# Patient Record
Sex: Female | Born: 1975
Health system: Southern US, Community
[De-identification: ages and names within clinical notes are randomized; demographics above are authoritative.]

## PROBLEM LIST (undated history)

## (undated) DIAGNOSIS — E039 Hypothyroidism, unspecified: Secondary | ICD-10-CM

## (undated) DIAGNOSIS — F4541 Pain disorder exclusively related to psychological factors: Secondary | ICD-10-CM

## (undated) DIAGNOSIS — K469 Unspecified abdominal hernia without obstruction or gangrene: Secondary | ICD-10-CM

## (undated) DIAGNOSIS — M722 Plantar fascial fibromatosis: Secondary | ICD-10-CM

## (undated) DIAGNOSIS — N760 Acute vaginitis: Secondary | ICD-10-CM

## (undated) DIAGNOSIS — B9689 Other specified bacterial agents as the cause of diseases classified elsewhere: Secondary | ICD-10-CM

## (undated) HISTORY — DX: Pain disorder exclusively related to psychological factors: F45.41

## (undated) HISTORY — DX: Unspecified abdominal hernia without obstruction or gangrene: K46.9

## (undated) HISTORY — DX: Plantar fascial fibromatosis: M72.2

## (undated) HISTORY — PX: HERNIA REPAIR: SHX51

## (undated) HISTORY — DX: Other specified bacterial agents as the cause of diseases classified elsewhere: B96.89

## (undated) HISTORY — DX: Acute vaginitis: N76.0

## (undated) HISTORY — PX: MYOMECTOMY: SHX85

---

## 1998-03-13 ENCOUNTER — Emergency Department (HOSPITAL_COMMUNITY): Admission: EM | Admit: 1998-03-13 | Discharge: 1998-03-13 | Payer: Self-pay | Admitting: Emergency Medicine

## 2000-12-08 ENCOUNTER — Other Ambulatory Visit: Admission: RE | Admit: 2000-12-08 | Discharge: 2000-12-08 | Payer: Self-pay | Admitting: *Deleted

## 2001-06-06 ENCOUNTER — Encounter: Payer: Self-pay | Admitting: Obstetrics

## 2001-06-06 ENCOUNTER — Ambulatory Visit (HOSPITAL_COMMUNITY): Admission: RE | Admit: 2001-06-06 | Discharge: 2001-06-06 | Payer: Self-pay | Admitting: Obstetrics

## 2003-08-27 ENCOUNTER — Encounter (INDEPENDENT_AMBULATORY_CARE_PROVIDER_SITE_OTHER): Payer: Self-pay | Admitting: *Deleted

## 2003-08-27 LAB — CONVERTED CEMR LAB

## 2003-08-30 ENCOUNTER — Encounter: Admission: RE | Admit: 2003-08-30 | Discharge: 2003-08-30 | Payer: Self-pay | Admitting: Family Medicine

## 2003-09-03 ENCOUNTER — Encounter: Admission: RE | Admit: 2003-09-03 | Discharge: 2003-09-03 | Payer: Self-pay | Admitting: Family Medicine

## 2003-09-04 ENCOUNTER — Encounter: Admission: RE | Admit: 2003-09-04 | Discharge: 2003-09-04 | Payer: Self-pay | Admitting: Family Medicine

## 2003-09-06 ENCOUNTER — Encounter: Admission: RE | Admit: 2003-09-06 | Discharge: 2003-09-06 | Payer: Self-pay | Admitting: Family Medicine

## 2003-10-17 ENCOUNTER — Encounter: Admission: RE | Admit: 2003-10-17 | Discharge: 2003-10-17 | Payer: Self-pay | Admitting: Obstetrics and Gynecology

## 2003-11-18 ENCOUNTER — Encounter (INDEPENDENT_AMBULATORY_CARE_PROVIDER_SITE_OTHER): Payer: Self-pay | Admitting: Specialist

## 2003-11-18 ENCOUNTER — Inpatient Hospital Stay (HOSPITAL_COMMUNITY): Admission: RE | Admit: 2003-11-18 | Discharge: 2003-11-20 | Payer: Self-pay | Admitting: Family Medicine

## 2003-12-05 ENCOUNTER — Encounter: Admission: RE | Admit: 2003-12-05 | Discharge: 2003-12-05 | Payer: Self-pay | Admitting: Obstetrics and Gynecology

## 2004-01-02 ENCOUNTER — Encounter: Admission: RE | Admit: 2004-01-02 | Discharge: 2004-01-02 | Payer: Self-pay | Admitting: Obstetrics and Gynecology

## 2004-01-16 ENCOUNTER — Inpatient Hospital Stay (HOSPITAL_COMMUNITY): Admission: AD | Admit: 2004-01-16 | Discharge: 2004-01-17 | Payer: Self-pay | Admitting: Obstetrics & Gynecology

## 2004-08-13 ENCOUNTER — Encounter (INDEPENDENT_AMBULATORY_CARE_PROVIDER_SITE_OTHER): Payer: Self-pay | Admitting: Specialist

## 2004-08-13 ENCOUNTER — Inpatient Hospital Stay (HOSPITAL_COMMUNITY): Admission: RE | Admit: 2004-08-13 | Discharge: 2004-08-16 | Payer: Self-pay | Admitting: Gynecology

## 2004-09-24 ENCOUNTER — Other Ambulatory Visit: Admission: RE | Admit: 2004-09-24 | Discharge: 2004-09-24 | Payer: Self-pay | Admitting: Gynecology

## 2005-07-21 IMAGING — US US OB COMP LESS 14 WK
1 series · 18 of 28 positions shown · non-contrast
Comparison: none

CLINICAL DATA: Pelvic pain. History of fibroid resection October 2003.  LMP 11/28/03.  
OB ULTRASOUND 
There is an intrauterine pregnancy.  Crown-rump length is 1.16 cm corresponding to 7 week, 3 days. Fetal heart rate of 150 beats per minute was documented. There is no subchorionic hemorrhage. 
There is a small amount of free fluid.  The ovaries are not seen due to enlarged uterus.  There is a large fibroid on the left side of the uterus measuring 5 x 5.5 cm.  There is a smaller anterior fibroid measuring 1.5 cm. 
IMPRESSION
Intrauterine pregnancy at 7 weeks, 3 days.  Negative for subchorionic hemorrhage.
Multiple uterine fibroids, the largest on the left measuring 5 x 5.5 cm.

[Series 1: us ob comp<14 wk · 18 of 37 slices shown]
[im 1/37]
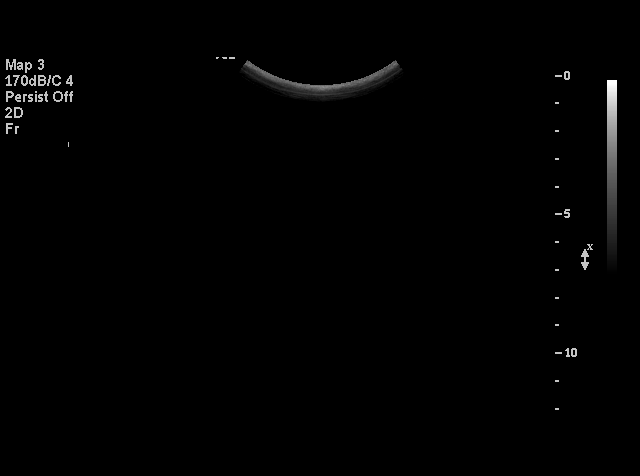
[im 3/37]
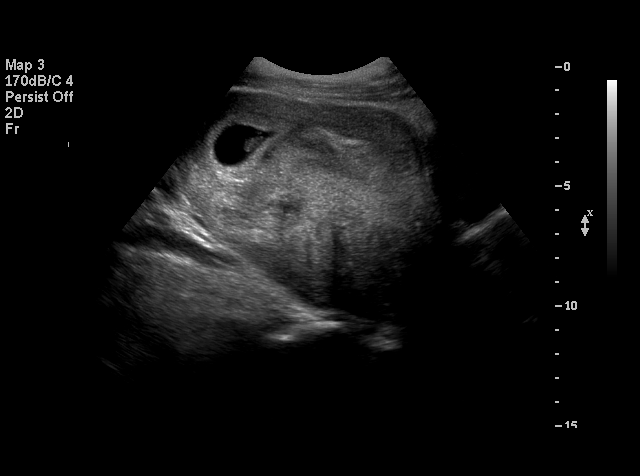
[im 5/37]
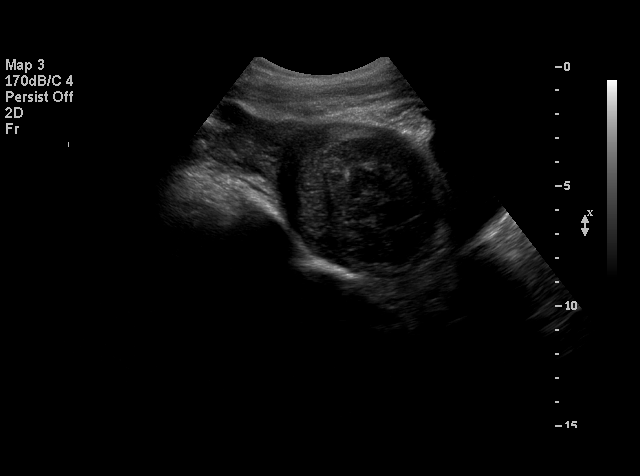
[im 7/37]
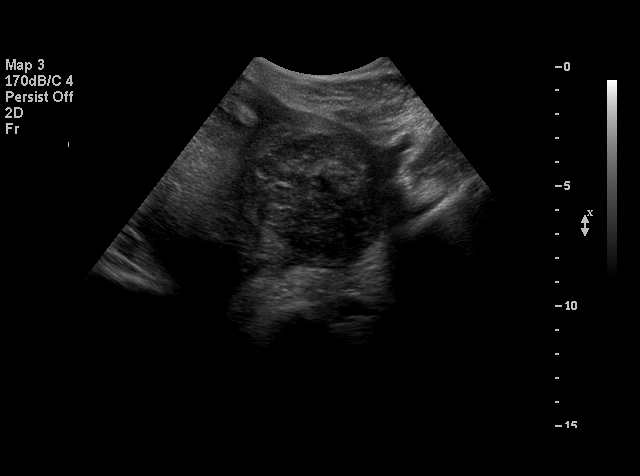
[im 10/37]
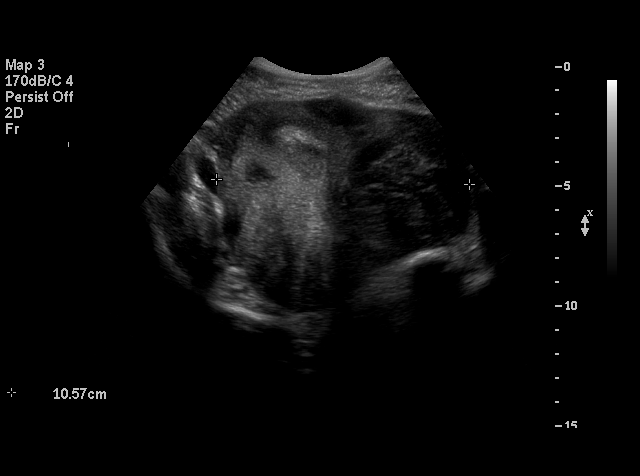
[im 11/37]
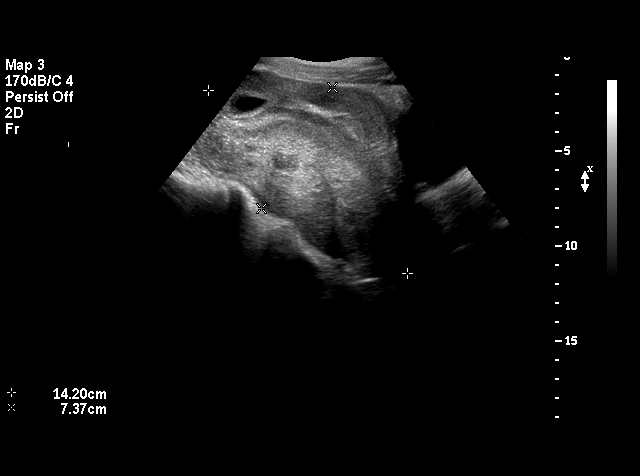
[im 14/37]
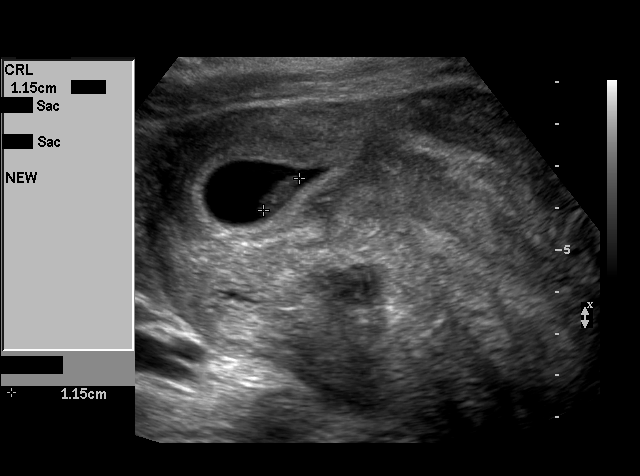
[im 15/37]
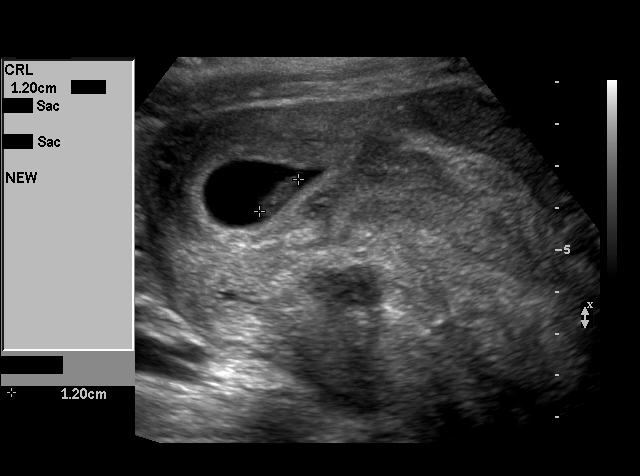
[im 18/37]
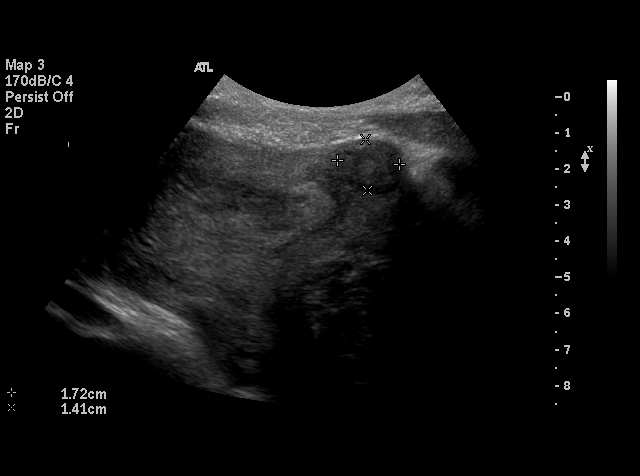
[im 19/37]
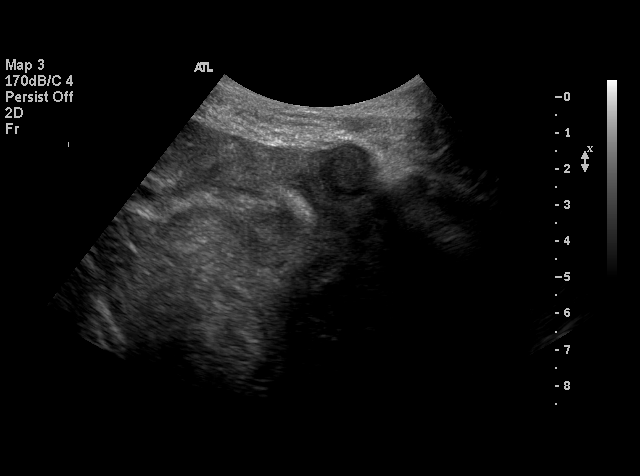
[im 22/37]
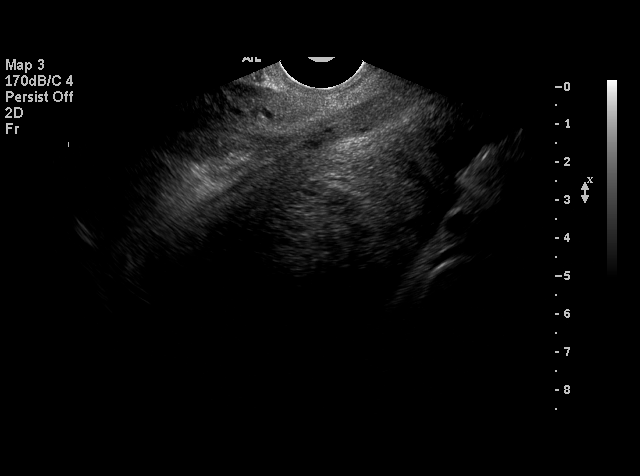
[im 23/37]
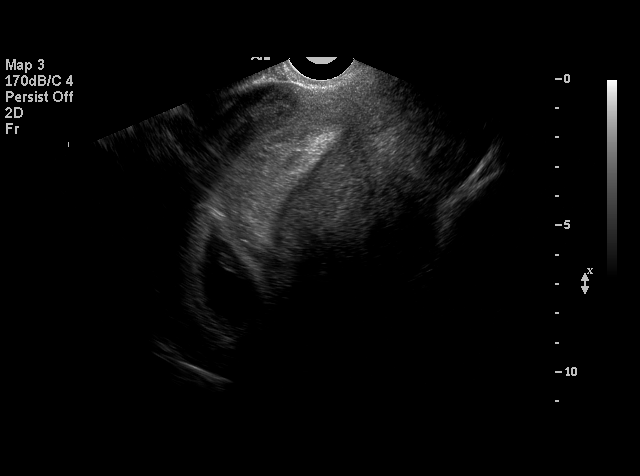
[im 26/37]
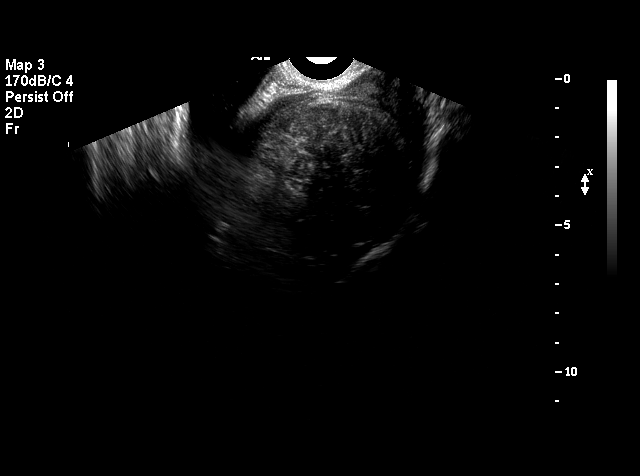
[im 29/37]
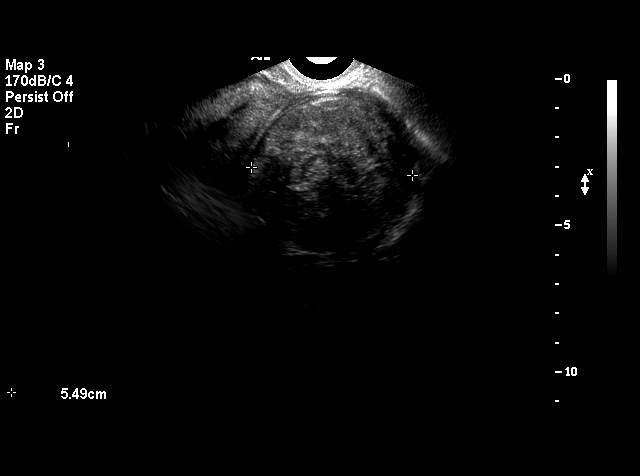
[im 30/37]
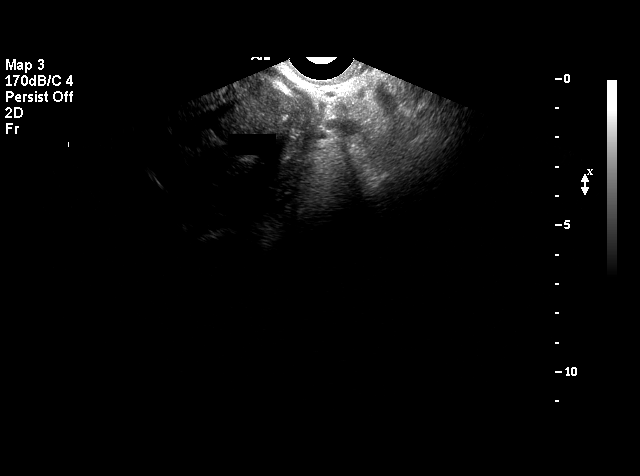
[im 33/37]
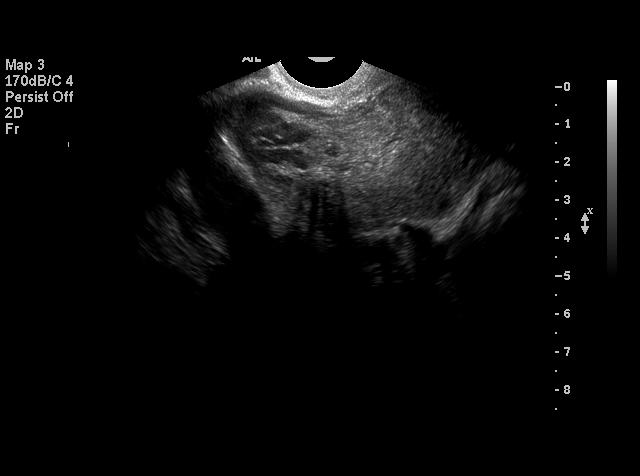
[im 34/37]
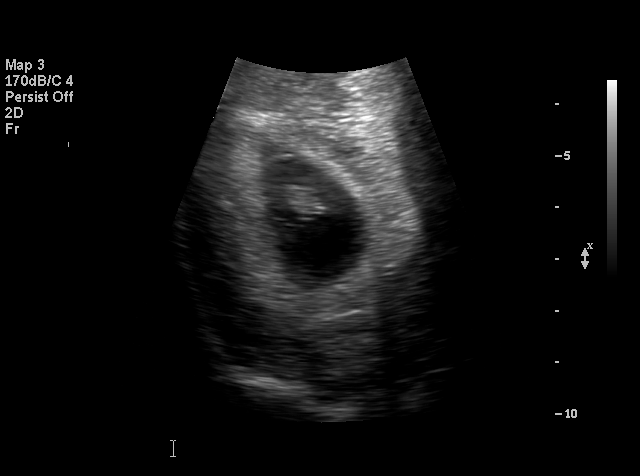
[im 37/37]
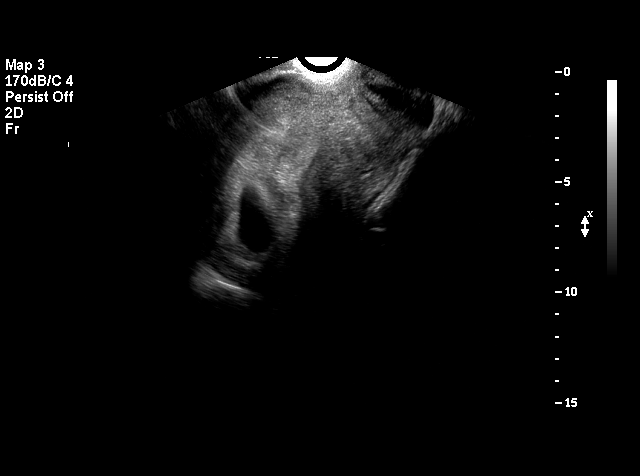

[18 of 28 positions shown; findings below may reference images not displayed]

## 2005-09-29 ENCOUNTER — Other Ambulatory Visit: Admission: RE | Admit: 2005-09-29 | Discharge: 2005-09-29 | Payer: Self-pay | Admitting: Gynecology

## 2006-09-23 ENCOUNTER — Encounter (INDEPENDENT_AMBULATORY_CARE_PROVIDER_SITE_OTHER): Payer: Self-pay | Admitting: *Deleted

## 2006-10-12 ENCOUNTER — Other Ambulatory Visit: Admission: RE | Admit: 2006-10-12 | Discharge: 2006-10-12 | Payer: Self-pay | Admitting: Gynecology

## 2006-12-07 ENCOUNTER — Ambulatory Visit (HOSPITAL_COMMUNITY): Admission: RE | Admit: 2006-12-07 | Discharge: 2006-12-07 | Payer: Self-pay | Admitting: General Surgery

## 2008-10-09 ENCOUNTER — Inpatient Hospital Stay (HOSPITAL_COMMUNITY): Admission: RE | Admit: 2008-10-09 | Discharge: 2008-10-12 | Payer: Self-pay | Admitting: Obstetrics and Gynecology

## 2008-12-16 ENCOUNTER — Ambulatory Visit: Payer: Self-pay | Admitting: Women's Health

## 2009-03-27 ENCOUNTER — Ambulatory Visit: Payer: Self-pay | Admitting: Women's Health

## 2009-08-01 ENCOUNTER — Ambulatory Visit: Payer: Self-pay | Admitting: Women's Health

## 2010-03-02 ENCOUNTER — Ambulatory Visit (HOSPITAL_COMMUNITY): Admission: RE | Admit: 2010-03-02 | Discharge: 2010-03-02 | Payer: Self-pay | Admitting: Surgery

## 2010-03-20 ENCOUNTER — Ambulatory Visit: Payer: Self-pay | Admitting: Women's Health

## 2010-03-27 ENCOUNTER — Ambulatory Visit: Payer: Self-pay | Admitting: Women's Health

## 2010-04-06 ENCOUNTER — Ambulatory Visit: Payer: Self-pay | Admitting: Gynecology

## 2010-05-11 ENCOUNTER — Ambulatory Visit: Payer: Self-pay | Admitting: Gynecology

## 2010-05-27 ENCOUNTER — Ambulatory Visit: Payer: Self-pay | Admitting: Gynecology

## 2010-08-15 ENCOUNTER — Encounter: Payer: Self-pay | Admitting: Family Medicine

## 2010-10-10 LAB — SURGICAL PCR SCREEN: Staphylococcus aureus: NEGATIVE

## 2010-10-19 ENCOUNTER — Other Ambulatory Visit: Payer: Self-pay | Admitting: Women's Health

## 2010-10-19 ENCOUNTER — Encounter (INDEPENDENT_AMBULATORY_CARE_PROVIDER_SITE_OTHER): Payer: BC Managed Care – PPO | Admitting: Women's Health

## 2010-10-19 ENCOUNTER — Other Ambulatory Visit (HOSPITAL_COMMUNITY)
Admission: RE | Admit: 2010-10-19 | Discharge: 2010-10-19 | Disposition: A | Discharge status: Home / Self Care | Payer: BC Managed Care – PPO | Source: Ambulatory Visit | Attending: Obstetrics and Gynecology | Admitting: Obstetrics and Gynecology

## 2010-10-19 DIAGNOSIS — Z01419 Encounter for gynecological examination (general) (routine) without abnormal findings: Secondary | ICD-10-CM

## 2010-10-19 DIAGNOSIS — Z833 Family history of diabetes mellitus: Secondary | ICD-10-CM

## 2010-10-19 DIAGNOSIS — E079 Disorder of thyroid, unspecified: Secondary | ICD-10-CM

## 2010-10-19 DIAGNOSIS — Z113 Encounter for screening for infections with a predominantly sexual mode of transmission: Secondary | ICD-10-CM

## 2010-10-19 DIAGNOSIS — Z124 Encounter for screening for malignant neoplasm of cervix: Secondary | ICD-10-CM | POA: Insufficient documentation

## 2010-11-05 LAB — CBC
Hemoglobin: 7.8 g/dL — CL (ref 12.0–15.0)
MCHC: 33.2 g/dL (ref 30.0–36.0)
MCHC: 33.8 g/dL (ref 30.0–36.0)
MCV: 92.7 fL (ref 78.0–100.0)
Platelets: 201 10*3/uL (ref 150–400)
RBC: 2.48 MIL/uL — ABNORMAL LOW (ref 3.87–5.11)
RDW: 13.6 % (ref 11.5–15.5)
WBC: 8.7 10*3/uL (ref 4.0–10.5)

## 2010-11-05 LAB — RPR: RPR Ser Ql: NONREACTIVE

## 2010-11-11 ENCOUNTER — Other Ambulatory Visit (HOSPITAL_COMMUNITY)
Admission: RE | Admit: 2010-11-11 | Discharge: 2010-11-11 | Disposition: A | Discharge status: Home / Self Care | Payer: BC Managed Care – PPO | Source: Ambulatory Visit | Attending: Gynecology | Admitting: Gynecology

## 2010-11-11 ENCOUNTER — Ambulatory Visit (INDEPENDENT_AMBULATORY_CARE_PROVIDER_SITE_OTHER): Payer: BC Managed Care – PPO | Admitting: Women's Health

## 2010-11-11 ENCOUNTER — Other Ambulatory Visit: Payer: Self-pay | Admitting: Women's Health

## 2010-11-11 DIAGNOSIS — R87619 Unspecified abnormal cytological findings in specimens from cervix uteri: Secondary | ICD-10-CM | POA: Insufficient documentation

## 2010-11-11 DIAGNOSIS — R8789 Other abnormal findings in specimens from female genital organs: Secondary | ICD-10-CM

## 2010-12-08 NOTE — Op Note (Signed)
NAME:  Kendra Griffin, Kendra Griffin              ACCOUNT NO.:  [id number]   MEDICAL RECORD NO.:  [id number]          PATIENT TYPE:  AMB   LOCATION:  DAY                          FACILITY:  [facility]   PHYSICIAN:  Ollen Gross. Vernell Morgans, M.D. DATE OF BIRTH:  01-30-1976   DATE OF PROCEDURE:  12/07/2006  DATE OF DISCHARGE:                               OPERATIVE REPORT   PREOPERATIVE DIAGNOSIS:  Umbilical hernia.   POSTOPERATIVE DIAGNOSIS:  Umbilical hernia.   PROCEDURES:  Umbilical hernia repair with mesh.   SURGEON:  Ollen Gross. Vernell Morgans, M.D.   ANESTHESIA:  General endotracheal.   PROCEDURE:  After informed consent was obtained, the patient was brought  to the operating placed in supine position on the table.  After adequate  induction of general anesthesia, the patient's abdomen was prepped with  Betadine and draped in usual sterile manner.  The area around the  umbilicus infiltrated with 0.25% Marcaine.  Small transverse  infraumbilical incision was made with 15 blade knife.  This incision was  carried down through the skin and subcutaneous tissue sharply with  electrocautery.  The hernia sac was identified.  It was opened sharply  with electrocautery.  There was no fat or viscera within the sac.  Sac  was excised at the edge of the fascia sharply with electrocautery.  The  edges were healthy and clean.  The hernia defect was then repaired with  interrupted #1 Novofil stitches.  The subcutaneous tissue was then  separated from the fascia circumferentially around the wound.  A piece  of 3 x 6 Ultrapro mesh was chosen and cut and a small piece of this mesh  was cut to fit over the repair.  The mesh was then anchored to the  fascia of the anterior abdominal wall with interrupted 2-0 Vicryl  stitches.  Subcutaneous tissue was irrigated copious amounts of saline.  The subcutaneous tissue was then closed with interrupted 3-0 Vicryl  stitches and the skin was closed a running for Monocryl subcuticular  stitch.   Benzoin, Steri-Strips and sterile dressings were applied.  The  patient tolerated well.  At end the case all needle, sponge instrument  counts correct.  The patient was awakened and taken recovery in stable  condition.      Ollen Gross. Vernell Morgans, M.D.  Electronically Signed     PST/MEDQ  D:  12/07/2006  T:  12/07/2006  Job:  [id number]

## 2010-12-08 NOTE — Discharge Summary (Signed)
NAME:  Kendra Griffin, Kendra Griffin              ACCOUNT NO.:  [id number]   MEDICAL RECORD NO.:  [id number]          PATIENT TYPE:  INP   LOCATION:  9120                          [facility]ILITY:  [facility]   PHYSICIAN:  Maxie Better, M.D.DATE OF BIRTH:  Aug 30, 1975   DATE OF ADMISSION:  10/09/2008  DATE OF DISCHARGE:  10/12/2008                               DISCHARGE SUMMARY   DIAGNOSES:  Term gestation, scheduled repeat cesarean section, previous  C-section , fibroid uterus.   </DIAGNOSES/  repeat cesarean section at term, previous C-section, fibroid uterus   REASON FOR ADMISSION:  Scheduled repeat cesarean section.   HISTORY:  Gravida 3, para 1 with EDC, October 14, 2008, at 74 and 2/7th  weeks intrauterine pregnancy.   PRENATAL CARE:  At [facility] since 11  weeks' gestation.   PRIMARY CARE Sabria Florido:  Maxie Better, MD   RISK FACTORS IN PREGNANCY:  Umbilical hernia, uterine fibroid.   PRENATAL LABORATORY DATA:  A positive, antibody negative, RPR  nonreactive, rubella immune, hepatitis B surface antigen negative, HIV  nonreactive.  Hemoglobin electrophoresis with normal adult hemoglobin.   PAST MEDICAL HISTORY:  1. Uterine fibroid.  2. Gonorrhea in 2009.  3. History of Trichomonas.  4. History of recurrent bacterial vaginosis.   SURGICAL HISTORY:  Myomectomy in April 2005, primary cesarean section in  2006, hernia repair in 2008.   PREGNANCY AND GYN HISTORY:  Gravida 3, para 1-0-1-1 at 59 and 2/7th  weeks' intrauterine pregnancy.   SOCIAL HISTORY:  Kendra Griffin is a single [place] American female.  Her significant other is Kendra Griffin, supportive of pregnancy.  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  She smokes  approximately 1 cigarette per week.  Denies alcohol use or substance  abuse since last menstrual period.   FAMILY HISTORY:  Diabetes in mother, borderline diabetic, chronic  hypertension, mother and sister, thyroid  dysfunction unspecified,  sister.   PHYSICAL EXAMINATION:   ADMISSION LABORATORY DATA:  October 09, 2008, at 12:34, hemoglobin 11.1,  hematocrit 32.8, white blood cell count 8.7, and platelet count 201.   LABOR SUMMARY:  Nonapplicable.  The patient did not labor prior to her  scheduled repeat C-section.   DELIVERY SUMMARY:  The patient underwent a scheduled repeat cesarean  section and Kerr hysterotomy on October 09, 2008, at 13:59 [place]  Standard Time.  She delivered a viable female weighing 7 pounds and 15  ounces with spontaneous respiration.  Membranes were ruptured  artificially on October 09, 2008 at 1358 and placenta manually delivered  by manual removal on October 09, 2008, at 1400 [place] Standard Time.  The  newborn was transferred to newborn nursery.  Apgars were 9 and 9.  Delivery Nakyia Dau, Dr. Maxie Better.  Anesthesiologist, Dr.  Malen Gauze.  Delivery anesthesia, spinal.  The patient received Ancef 2 g in  the operating room on October 09, 2008, at 1328 and 2 subsequent doses 8  hours apart.  Please see Dr. Maxie Better' operative report for  complete details for surgery.   HOSPITAL COURSE:  Postpartum, the patient experienced a normal  uneventful  postpartum stay in the Mother Baby Unit.  Vital signs  remained stable and she remained afebrile.  Her CBC on postop day 1,  hemoglobin 7.8 g/dL, hematocrit 21.3, white blood cell count was 8.5,  and platelet count 139,000.   DISCHARGE:  The patient was discharged home with her newborn on October 12, 2008, in the a.m.   STATUS:  Stable.   MEDICATIONS:  Continue prenatal vitamin 1 p.o. daily, Percocet 5/325 mg  1-2 tablets every 4-6 hours p.Kendran. dispensed #30 with no refills, Motrin  600 mg 1 tablet p.o. q.6 h. p.Kendran. if needed for cramping or breast  pain, ferrous sulfate 325 mg p.o. t.i.d. with orange juice.  Docusate  sodium over-the-counter 100 mg 1-2 tablets daily p.Kendran. as a stool  softener, simethicone 80 mg  chewable p.o. t.i.d. with meals p.Kendran. to  decrease discomfort from gas formation.   INSTRUCTION:  Postpartum instruction was given.  The patient instructed  to call the office with fever, uterine tenderness, and increased in  lochia or foul-smelling lochia, calf pain or tenderness, shortness of  breath, difficulty breathing, severe headache, chest pain or abdominal  pain or any other complaints.  She is instructed to return to [facility] on Wednesday, October 16, 2008, for RN visit to remove staples.  She will schedule a postop visit at 6 weeks postpartum with Dr.  Maxie Better at [facility].      Merrilee Jansky, CNM      Maxie Better, M.D.  Electronically Signed    DL/MEDQ  D:  08/65/7846  T:  10/13/2008  Job:  [id number]

## 2010-12-08 NOTE — Op Note (Signed)
NAME:  Kendra Griffin, Kendra Griffin              ACCOUNT NO.:  [id number]   MEDICAL RECORD NO.:  [id number]          PATIENT TYPE:  INP   LOCATION:  9120                          [facility]AC[facility]   PHYSICIAN:  Maxie Better, M.D.DATE OF BIRTH:  Jan 21, 1976   DATE OF PROCEDURE:  10/09/2008  DATE OF DISCHARGE:                               OPERATIVE REPORT   PREOPERATIVE DIAGNOSES:  1. Fibroid uterus.  2. Previous cesarean section.  3. Term gestation.   PROCEDURES:  Repeat cesarean section Kerr hysterotomy.   POSTOPERATIVE DIAGNOSES:  1. Fibroid uterus.  2. Previous cesarean section.  3. Term gestation.   ANESTHESIA:  Spinal.   SURGEON:  Maxie Better, MD   ASSISTANT:  Marlinda Mike, CNM   INDICATION:  A 35 year old gravida 2, para 1 female at term with fibroid  uterus, previous cesarean section, 4 previous myomectomy, now admitted  for repeat cesarean section.  Prenatal course had been uncomplicated.  Surgical risk was reviewed with the patient.  Consent was signed and the  patient was transferred to the operating room.   PROCEDURE:  Under adequate spinal anesthesia, the patient was placed in  a supine position with a left lateral tilt.  She was sterilely prepped  and draped in usual fashion.  Indwelling Foley catheter was sterilely  placed.  Marcaine 0.25% was injected along the previous Pfannenstiel  skin incision.  Pfannenstiel skin incision was then made, carried down  to the rectus fascia.  Rectus fascia was opened transversely.  The  rectus fascia was carefully, sharply and bluntly dissected off the  rectus muscle in superior and inferior fashion.  The rectus muscle was  split in midline.  The parietal peritoneum was entered sharply and  extended at that point.  The bladder was noted to be adherent in part in  the right aspect of the uterus and in the anterior abdominal wall.  This  was carefully taken down and displaced with the with the bladder  retractor.  A curvilinear  low transverse uterine incision was then made  and extended with bandage scissors.  Artificial rupture of membranes was  performed.  Clear amniotic fluid noted.  Subsequent attempt at delivery  was initially unsuccessful of a vertex.  Therefore, vacuum was applied,  and pulled x1 with good delivery of a live female, who was bulb  suctioned.  The umbilical cord was clamped and cut.  The baby was  transferred to the awaiting pediatrician who assigned Apgars of 9 or 9  at one and five minutes.  Placenta was manually removed.  Uterine cavity  was cleaned of debris.  Uterine cavity was without any palpable  fibroids.  Uterine incision had no extension.  The additional adhesions  on the right from the bladder was taken down to facilitate the closure  of the uterine incision.  Uterine incision was closed in 2 layers.  The  first layer with 0 Monocryl running locking stitch, second layer was  imbricated using a 0 Monocryl suture.  Figure-of-eight suture was placed  along the incision for additional hemostasis.  Normal tubes and ovaries  were noted bilaterally, but  they were displaced in location due to the  multiple fibroids, they were noted particularly on the lateral aspect of  the uterus on the left side and a large intramural fibroid anteriorly.  The abdomen was then copiously irrigated and suctioned of debris due to  the location of the bladder with respect to the prior adhesions.  Decision was then made not to close the parietal peritoneum.  The lower  portion of the muscle was placed approximately with interrupted 2-0  Vicryl suture.  The rectus fascia was closed with 0 Vicryl x2.  The  subcutaneous area was irrigated, small bleeders cauterized and the skin  approximated using Ethicon staples.   SPECIMEN:  Placenta, not sent.   ESTIMATED BLOOD LOSS:  700 mL.   URINE OUTPUT:  400 mL, clear yellow urine.   INTRAOPERATIVE FLUID:  4 L.   Sponge and instrument counts x2 was correct.    COMPLICATIONS:  None.   Weight of the baby was 7 pounds 15 ounces.  The patient tolerated the  procedure well and was transferred to the recovery room in stable  condition.      Maxie Better, M.D.  Electronically Signed     [place]/MEDQ  D:  10/09/2008  T:  10/10/2008  Job:  [id number]

## 2010-12-11 NOTE — Discharge Summary (Signed)
NAME:  MAGGI, HERSHKOWITZ              ACCOUNT NO.:  [id number]   MEDICAL RECORD NO.:  [id number]          PATIENT TYPE:  INP   LOCATION:  9111                          [facility]   PHYSICIAN:  Juan H. Lily Peer, M.D.DATE OF BIRTH:  [birth year]   DATE OF ADMISSION:  08/13/2004  DATE OF DISCHARGE:  08/16/2004                                 DISCHARGE SUMMARY   HISTORY:  The patient is a 35 year old, gravida 3, para 0, at 37-1/2 weeks  estimated gestational age, who, on January 19, underwent a primary cesarean  section by Dr. Douglass Rivers, secondary to the fact that patient had recent  multiple myomectomy.  The patient delivered a viable female infant, Apgars  of 7 and 9.  The patient, postoperatively did well.  Her postoperative day  #1, her hemoglobin was 10.0.  She was started on clear liquid diet.  Her  Foley catheter was removed.  Her diet was advanced.  She was up ambulating  and voiding and eventually by the second day was tolerating her regular  diet, and she was ready to be discharged home on her third day when she  continued to remain afebrile with stable vital signs.  Her blood type is A  positive.  She was rubella immune, and her hemoglobin was reported at 10 g.  The patient was to have her staples removed prior to discharge from the  hospital.   FINAL DIAGNOSES:  1.  Term intrauterine pregnancy.  2.  History of multiple myomectomies.   PROCEDURE PERFORMED:  Primary lower uterine segment transverse cesarean  section.   FINAL DISPOSITION AND FOLLOW UP:  The patient was discharged home on her  third postoperative day.  She had been up and ambulating, voiding well,  tolerating a regular diet well, and had passed flatus.  Her incision was  intact.  The uterine staples were removed and steri-stripped, and she was  discharged home with a prescription for Lortab 7.5/500 to take 1 p.o. q.4-  6h. p.r.n. pain and to continue her prenatal vitamins.  To follow up in  [facility] in 6 weeks for a postpartum visit.  Discharge  instructions are provided as well in written form.     Juan   JHF/MEDQ  D:  08/16/2004  T:  08/16/2004  Job:  [id number]

## 2010-12-11 NOTE — Op Note (Signed)
NAME:  Kendra Griffin, Kendra Griffin                        ACCOUNT NO.:  [id number]   MEDICAL RECORD NO.:  [id number]                   PATIENT TYPE:  INP   LOCATION:  9316                                 [facility]   PHYSICIAN:  Tanya S. Shawnie Pons, M.D.                DATE OF BIRTH:  19-Jul-1976   DATE OF PROCEDURE:  11/18/2003  DATE OF DISCHARGE:                                 OPERATIVE REPORT   PREOPERATIVE DIAGNOSES:  1. Fibroid uterus.  2. Ureteral obstruction.  3. Pelvic pain.   POSTOPERATIVE DIAGNOSES:  1. Fibroid uterus.  2. Ureteral obstruction.  3. Pelvic pain.   PROCEDURE:  Myomectomy.   SURGEON:  Shelbie Proctor. Shawnie Pons, M.D.   ASSISTANT:  Lesly Dukes, M.D.   FINDINGS:  A large fibroid uterus with multiple fibroids.   ESTIMATED BLOOD LOSS:  500 mL.   COMPLICATIONS:  None.   REASON FOR PROCEDURE:  Briefly, the patient is a 35 year old gravida 2, para  0, who has a large fibroid uterus that had grown significantly in the past  three years.  In fact, she had bladder and ureteral obstruction.  She did  desire a myomectomy.   PROCEDURE:  The patient was taken to the OR.  She was placed in the supine  position.  She was then prepped and draped in the usual sterile fashion.  A  Pfannenstiel incision was then made with a knife, carried down to the  underlying fascia using the electrocautery.  The fascia was divided  laterally and then Kocher clamps were used to elevate the fascia off the  underlying rectus, which was divided off bluntly laterally and sharply in  the midline, both superiorly and inferiorly.  Attempt was made to enter the  peritoneum.  However, the bladder was noted to be exceptionally high and  then the peritoneum entered.  The peritoneum was then taken down laterally  from the bladder, where a large fibroid uterus was noted.  The uterus was  partially pulled up and out of the incision.  Initially incision was made on  the anterior  portion of the uterus after  injection with 5 mL of Pitressin.  The fibroid was then bluntly dissected out until the base was located, which  was clamped.  This fibroid was approximately 5 cm in size.  A suture was  then used to hold the pedicle that the Tresa Endo was holding and then a second 4  cm fibroid was removed from the same incision.  The uterine incision was  then closed with a deep layer of 0 Vicryl suture, followed by baseball  stitch of the serosa with a 2-0 Vicryl.  Attention was then turned to the  fundus of the uterus, where there were two large fibroids.  After injection  with Pitressin to the myometrium, the incision was made across the top of  the fundus.  Initially a fibroid that was located more  anterior was bluntly  dissected out, followed by clamping at the base of the Kelly clamp.  When  the base was suture ligated.  The first fibroid was approximately 5 cm, a  second larger fibroid of approximately 6-7 cm was then removed posteriorly  from the fundus.  During the dissection of this, the endometrial cavity was  entered.  The base of this was clamped with a Kelly clamp and was suture  ligated.  The uterine incision there was then closed with several figure-of-  eights, deep layer of 0 Vicryl, followed by myometrial closure of 0 Vicryl  in a locked running fashion.  A baseball stitch of 2-0 Vicryl was then used  to close the serosal layer.  Finally a large fibroid was felt to be  posterior.  This was actually stuck behind the sacral promontory.  It was  pulled up as much as it could and the myometrium injected with Pitressin.  A  large incision was made in the posterior uterus until the fibroid was  located.  Then by blunt dissection this fibroid was slowly and carefully  removed from the underlying myometrium.  When it was finally delivered from  the abdomen, it was approximately 10 cm in size.  The base was clamped x2  with Kellys and these were suture ligated.  Several figure-of-eights were  then  used to close the uterine incision at the deepest level, followed by  locked, running myometrial stitch of 0 Vicryl to close the myometrium.  A 2-  0 Vicryl was then used to do a baseball stitch in the remaining serosa.  Areas of the myometrium that were injured with the towel clips by trying to  grasp this posterior fibroid were also closed with a 2-0 running suture.  When hemostasis was found to be adequate, attention was turned to a  pedunculated fibroid that was hanging off the patient's left anterior  portion of the uterus.  This was clamped with a Kelly clamp and then removed  and suture ligated.  When hemostasis was found to be adequate throughout,  Interceed was then placed over the posterior fundal incisions and the uterus  placed back into the abdominal cavity.  Attention was then turned to the  anterior incision.  It was found to be hemostatic and a piece of Interceed  was also placed over this.  The bladder was again inspected.  There appeared  to be a rent in the muscularis of the bladder, and this was closed with 4-0  Vicryl.  At the end of the case, sterile milk was then injected into the  Foley and there was no leakage from the bladder.  The rectus muscles were  then inspected and found to be hemostatic and the fascia closed with a #1  Vicryl suture in a running fashion.  The subcutaneous tissue irrigated and  any bleeders cauterized with the electrocautery.  The skin was closed using  staples.  The patient tolerated the procedure well.  She was awakened and  taken to the recovery room in stable condition.                                               Shelbie Proctor. Shawnie Pons, M.D.    TSP/MEDQ  D:  11/18/2003  T:  11/18/2003  Job:  [id number]

## 2010-12-11 NOTE — Discharge Summary (Signed)
NAME:  Kendra Griffin, Kendra Griffin                        ACCOUNT NO.:  [id number]   MEDICAL RECORD NO.:  [id number]                   PATIENT TYPE:  INP   LOCATION:  9316                                 [facility]   PHYSICIAN:  Phil D. Rose, M.D.                  DATE OF BIRTH:  08-29-1975   DATE OF ADMISSION:  11/18/2003  DATE OF DISCHARGE:                                 DISCHARGE SUMMARY   HOSPITAL COURSE:  The patient is a 35 year old black female who underwent  uterine myomectomy on the day of admission, has done well postoperatively  although on postoperative day #1 she had a Tmax of 100.6 followed by later  that day 100.2 but has been afebrile since that time and physical exam is  nonrevealing with the lungs clear.  The abdomen soft, flat, nonsignificantly  tender other than that expected by the surgery with active bowel sounds.  The wound is clean.  Extremities are negative.  Significant laboratory was  the admitting hemoglobin was 12.9 and on the day following surgery it was  9.2.  She had at least a 500 mL blood loss during the myomectomy, seems  comfortable, and is tolerating the postoperative pain quite well using  Percocet.  The patient is being discharged today with discharge instructions  as to activity, especially those related to heavy lifting and stairs.  For  follow-up, she will have the staples removed before discharge with Steri-  Strips placed which she will remove the following Monday, and return in 2  weeks to the GYN clinic to see Dr. Shawnie Pons.  No pathology is available at this  time.  Discharge instructions as to pain medication and diet have been given  to the patient as well.   DISCHARGE DIAGNOSIS:  Exploratory laparotomy with myomectomy status day #2  postoperative, satisfactory recovery.                                               Phil D. Okey Dupre, M.D.    PDR/MEDQ  D:  11/20/2003  T:  11/20/2003  Job:  [id number]

## 2010-12-11 NOTE — Op Note (Signed)
NAME:  Kendra Griffin, Kendra Griffin              ACCOUNT NO.:  [id number]   MEDICAL RECORD NO.:  [id number]          PATIENT TYPE:  INP   LOCATION:  NA                            FACILITY:  [facility]   PHYSICIAN:  Ivor Costa. Farrel Gobble, M.D. DATE OF BIRTH:  12/12/1975   DATE OF PROCEDURE:  08/13/2004  DATE OF DISCHARGE:                                 OPERATIVE REPORT   PREOPERATIVE DIAGNOSES:  1.  37 week intrauterine pregnancy.  2.  Recent multiple myomectomy.   POSTOPERATIVE DIAGNOSES:  1.  37 week intrauterine pregnancy.  2.  Recent multiple myomectomy.   PROCEDURE:  Primary cesarean section low flap transverse.   SURGEON:  Ivor Costa. Farrel Gobble, M.D.   ASSISTANTMarcial Pacas P. Fontaine, M.D.   ANESTHESIA:  Spinal.   IV FLUIDS:  3300 mL lactated Ringer's.   ESTIMATED BLOOD LOSS:  700 mL.   URINE OUTPUT:  300 mL of clear urine.   FINDINGS:  Viable female infant in the vertex presentation, clear amniotic  fluid. Apgar's 7 and 9, birth weight 6 pounds 10, uterus with fibroids.   COMPLICATIONS:  None.   PATHOLOGY:  Placenta.   DESCRIPTION OF PROCEDURE:  The patient was taken to the operating room and  placed in the supine position left lateral displacement, prepped and draped  in the usual sterile fashion. Spinal anesthesia was induced.  A Pfannenstiel  skin incision was made with the scalpel excising the previous multiple  myomectomy keloid scar which was passed off the field. The incision was then  carried through the underlying layer of fascia with the Bovie. The fascia  was nicked in the midline, incision extended laterally with the Bovie. The  inferior aspect of the fascial incision was grasped with Kocher's,  underlying rectus muscles were dissected off by blunt and sharp dissection.  In a similar fashion, the superior aspect of the incision was grasped with  Kocher's and the underlying rectus muscles were dissected off. The rectus  muscles were separated in the area of the midline, the  peritoneum was  entered bluntly. The incision was then extended superiorly and inferiorly  with good visualization of the underlying bowel and bladder. The  vesicouterine peritoneum was identified, tented up and entered sharply with  the Metzenbaum's, the incision was extended laterally. The bladder flap was  created digitally. There was marked varicosity of the lower uterine segment  and then a transverse incision was made in the scalpel above this area and  carried through until the amniotic fluid was reached then extended bluntly  and amniotomy was then performed. The infant was delivered from the vertex  presentation, cord was cut and clamped and handed off to the waiting  pediatricians. Cord bloods were obtained, the uterus was massaged and the  placenta was allowed to separate naturally. The uterus was then cleared of  all clots and debris. The uterine incision was repaired with a running  locked layer of #0 chromic and noted to be hemostatic afterwards. The pelvis  was irrigated with arm saline, the adnexa were inspected and noted to be  unremarkable.  Multiple uterine fibroids  were palpable superiorly. The  fascia was then closed with #0 Vicryl in a running fashion. The subcu was  irrigated and reapproximated with staples. The patient tolerated the  procedure well.  Sponge, lap and needle counts correct x2 and she was  transferred to the PACU in stable condition.      THL/MEDQ  D:  08/13/2004  T:  08/13/2004  Job:  [id number]

## 2010-12-11 NOTE — H&P (Signed)
NAME:  Kendra Griffin, Kendra Griffin              ACCOUNT NO.:  [id number]   MEDICAL RECORD NO.:  [id number]          PATIENT TYPE:  MAT   LOCATION:  MATC                          [facility]ACILITY:  [facility]   PHYSICIAN:  Ivor Costa. Farrel Gobble, M.D. DATE OF BIRTH:  Jun 27, 1976   DATE OF ADMISSION:  08/13/2004  DATE OF DISCHARGE:                                HISTORY & PHYSICAL   PRINCIPAL DIAGNOSIS:  Intrauterine pregnancy at 37-1/2 weeks with recent  multiple myomectomy.   HISTORY OF PRESENT ILLNESS:  The patient is a 35 year old G3, P0, with an  LMP of Dec 05, 2003, estimated date of confinement of September 02, 2004, with  a gestational age of 37-3/7 weeks, who presents for an elective primary  cesarean section.  The patient's pregnancy is complicated by multiple  myomectomy done November 18, 2003, with documentation of five uterine scars.  The patient had a consultation with [facility] very early in the  pregnancy because of what was perceived as a high risk because of the recent  multiple myomectomy and has done well throughout the pregnancy.   Her pregnancy was also complicated by first trimester Trichomonas.  Although  the patient is still with her current partner and has had test of cure, they  have not been sexually active throughout this pregnancy.  She reports good  fetal movement, no vaginal bleeding.  Her last cervical exam was long,  closed, and high.   PRENATAL LABORATORY DATA:  She is A positive, antibody negative, RPR  nonreactive, rubella immune.  Hepatitis B surface antigen nonreactive.  HIV  nonreactive.  GBS negative.  Please refer to the Hollister's.   PHYSICAL EXAMINATION:  GENERAL:  She is a well-appearing gravida in no acute  distress.  VITAL SIGNS:  Weight gain was 23 pounds in the pregnancy.  She is afebrile.  Vital signs are stable.  HEART:  Regular rate.  LUNGS:  Clear to auscultation.  ABDOMEN:  Soft and nontender with a fundal height of 40.  Fetal heart tones  were auscultated.  PELVIC:  Vaginal exam, as mentioned above, is long, closed, and high.  EXTREMITIES:  Negative.   IMPRESSION:  Recent multiple myomectomy with pregnancy now at term for a  primary cesarean section.  Risks and benefits of the procedure were  discussed and accepted by the patient, and we will proceed with surgery as  planned.      THL/MEDQ  D:  08/12/2004  T:  08/12/2004  Job:  [id number]

## 2010-12-11 NOTE — H&P (Signed)
Kendra Griffin, Kendra Griffin              ACCOUNT NO.:  [id number]   MEDICAL RECORD NO.:  [id number]           PATIENT TYPE:   LOCATION:                                [facility]  [facility]   PHYSICIAN:  Juan H. Lily Peer, M.D.     DATE OF BIRTH:   DATE OF ADMISSION:  10/07/2004  DATE OF DISCHARGE:                                HISTORY & PHYSICAL   CHIEF COMPLAINT:  Post date pregnancy, 59 weeks' estimated gestational age.   HISTORY OF PRESENT ILLNESS:  The patient is a 35 year old gravida 2, para 0,  AB1 with an estimated date of confinement of September 29, 2004, currently 41  weeks' gestation.  The patient was seen in the office today for a routine  prenatal visit and was asked to have an ultrasound if she had not delivered  for estimated fetal weight.  Her cervix was found to be 1 cm dilated, 70%  effaced, -2 station in the vertex presentation.  AFI is 14.2 and in the 65th  percentile for 41 weeks' gestation, and the estimated fetal weight was 8  pounds 15 ounces in the 71st percentile for 41 weeks' gestation.  She had a  reactive NST.  Her prenatal course is significant for the fact that she had  just an upper respiratory tract infection early in her pregnancy but had  poor weight gain and had been referred to the nutritionist for nutritional  guidance.  She gained a total of 14 pounds through her pregnancy.  Her GBS  culture was negative, and a diabetes screen had been normal.  She had iron  deficiency anemia for which she was placed on supplemental iron.   PAST MEDICAL HISTORY:  She has had one elective AB in 1997 and otherwise has  had an uneventful past medical history.   REVIEW OF SYSTEMS:  See Hollister form.   PHYSICAL EXAMINATION:  VITAL SIGNS:  Blood pressure 134/92, on repeat  110/56.  Urine was negative for protein and glucose.  HEENT:  Unremarkable.  NECK:  Supple.  Trachea midline.  No carotid bruits, no thyromegaly.  LUNGS:  Clear to auscultation without rhonchi or wheezes.  HEART:  Regular rate and rhythm with no murmurs or gallops.  BREASTS:  Exam not done.  ABDOMEN:  Gravid uterus.  Vertex presentation by [facility] maneuver and  confirmed by ultrasound with 39-40 cm fundal height.  PELVIC:  Cervix 1 cm, 70% effaced, -2 station.  EXTREMITIES:  1+.  Negative clonus.  Trace edema.   PRENATAL LABORATORY:  O positive blood type, negative antibody screen.  VDRL  and hepatitis B surface antigen and HIV were negative.  Rubella immune.  Maternal serum alpha fetoprotein was normal.  Diabetes screen was normal.  Iron deficiency anemia was noted at 28 weeks' gestation.  The patient was  placed on supplemental iron.  GBS culture was negative.  Pap smear was  normal.   ASSESSMENT:  [age]-year-old gravida 2, para 0, AB1 at 54 weeks'  estimated gestational age with an estimated fetal weight of 8 pounds 15  ounces with a reactive fetal heart tracing.  GBS culture was negative.  The  patient is scheduled to be admitted on the evening of October 08, 2004, for  Cervidil for cervical ripening followed by  Pitocin after 12 hours after cervical ripening.  The risks, benefits and  pros and cons of induction were discussed with the patient.  All questions  were answered.  Will follow accordingly.   PLAN:  As per assessment above.      JHF/MEDQ  D:  10/06/2004  T:  10/06/2004  Job:  [id number]

## 2011-02-10 ENCOUNTER — Encounter (INDEPENDENT_AMBULATORY_CARE_PROVIDER_SITE_OTHER): Payer: Self-pay | Admitting: Surgery

## 2011-02-10 ENCOUNTER — Ambulatory Visit (INDEPENDENT_AMBULATORY_CARE_PROVIDER_SITE_OTHER): Payer: BC Managed Care – PPO | Admitting: Surgery

## 2011-02-10 VITALS — BP 118/76 | HR 78 | Temp 98.6°F | Ht 66.0 in | Wt 171.0 lb

## 2011-02-10 DIAGNOSIS — R1011 Right upper quadrant pain: Secondary | ICD-10-CM

## 2011-02-10 NOTE — Progress Notes (Signed)
Subjective:     Patient ID: Kendra Griffin, female   DOB: [birth year], 35 y.o.   MRN: [id number]  HPI  The patient noted to last week has severe pain to the right and superior where hernia repair was. Is rather sharp. She thought she felt a lump there as well. It is gradually improved. No nausea or vomiting. Her regular bowel movements. No fevers chills or sweats. Sshe was worried that there may be the hernia come back and wish to be checked.  She notes in the past few days the pain is totally gone down. She could no longer feel a lump.   Review of Systems  Constitutional: Negative for fever, chills, diaphoresis, appetite change and fatigue.  HENT: Negative for nosebleeds, sore throat, mouth sores, neck pain and neck stiffness.   Eyes: Negative for photophobia, discharge and visual disturbance.  Respiratory: Negative for cough, choking, chest tightness and shortness of breath.   Cardiovascular: Negative for chest pain and palpitations.  Gastrointestinal: Negative for nausea, vomiting, abdominal pain, diarrhea, constipation, blood in stool, abdominal distention, anal bleeding and rectal pain.  Genitourinary: Negative for dysuria, frequency, flank pain, vaginal bleeding, vaginal discharge and difficulty urinating.  Musculoskeletal: Negative for back pain, arthralgias and gait problem.  Skin: Negative for color change, pallor and rash.  Neurological: Negative for dizziness, speech difficulty, weakness and numbness.  Hematological: Negative for adenopathy. Does not bruise/bleed easily.  Psychiatric/Behavioral: Negative for confusion and agitation. The patient is not nervous/anxious.        Objective:   Physical Exam  Constitutional: She is oriented to person, place, and time. She appears well-developed and well-nourished. No distress.  HENT:  Head: Normocephalic.  Mouth/Throat: Oropharynx is clear and moist.  Eyes: Conjunctivae are normal. Pupils are equal, round, and reactive to light.    Neck: Normal range of motion. No tracheal deviation present.  Cardiovascular: Normal rate and intact distal pulses.   Pulmonary/Chest: Effort normal. No respiratory distress.  Abdominal: Soft. She exhibits no distension and no mass. There is no tenderness. There is no rebound.  Musculoskeletal: Normal range of motion. She exhibits no edema.  Neurological: She is alert and oriented to person, place, and time. No cranial nerve deficit.  Skin: Skin is warm and dry. No rash noted. She is not diaphoretic. No erythema.  Psychiatric: She has a normal mood and affect. Her behavior is normal. Thought content normal.       Assessment:     Pain at old hernia surgery site, but no evidence of recurrence. Probable musculoskeletal strain. Resolving on its own.    Plan:     I discussed how I did repair again. It showed her to size the mesh I used. I see no evidence of a hernia coming back.  I suspect she had a musculoskeletal strain. It is better. Recommended ice, heat, nonsteroidals for pain.   If she gets worsening pain or feels a lump than I like to see her again. It is possible she could have a small hernia that I cannot feel. Diagnostic laparoscopy would be the backup plan as well. However, if she's feeling as well the soon and with no evidence of a hernia and her weight is in the normal range, then I think that there is no true abnormality right now. She felt reassured. Follow up p.r.n.

## 2011-06-03 ENCOUNTER — Encounter: Payer: Self-pay | Admitting: Gynecology

## 2011-06-03 DIAGNOSIS — D219 Benign neoplasm of connective and other soft tissue, unspecified: Secondary | ICD-10-CM | POA: Insufficient documentation

## 2011-06-04 ENCOUNTER — Ambulatory Visit (INDEPENDENT_AMBULATORY_CARE_PROVIDER_SITE_OTHER): Payer: BC Managed Care – PPO

## 2011-06-04 ENCOUNTER — Ambulatory Visit (INDEPENDENT_AMBULATORY_CARE_PROVIDER_SITE_OTHER): Payer: BC Managed Care – PPO | Admitting: Women's Health

## 2011-06-04 ENCOUNTER — Encounter: Payer: Self-pay | Admitting: Women's Health

## 2011-06-04 VITALS — BP 120/70

## 2011-06-04 DIAGNOSIS — N949 Unspecified condition associated with female genital organs and menstrual cycle: Secondary | ICD-10-CM

## 2011-06-04 DIAGNOSIS — D259 Leiomyoma of uterus, unspecified: Secondary | ICD-10-CM

## 2011-06-04 DIAGNOSIS — N898 Other specified noninflammatory disorders of vagina: Secondary | ICD-10-CM

## 2011-06-04 DIAGNOSIS — N760 Acute vaginitis: Secondary | ICD-10-CM

## 2011-06-04 DIAGNOSIS — B9689 Other specified bacterial agents as the cause of diseases classified elsewhere: Secondary | ICD-10-CM

## 2011-06-04 DIAGNOSIS — D219 Benign neoplasm of connective and other soft tissue, unspecified: Secondary | ICD-10-CM

## 2011-06-04 DIAGNOSIS — A499 Bacterial infection, unspecified: Secondary | ICD-10-CM

## 2011-06-04 DIAGNOSIS — B373 Candidiasis of vulva and vagina: Secondary | ICD-10-CM

## 2011-06-04 DIAGNOSIS — Z113 Encounter for screening for infections with a predominantly sexual mode of transmission: Secondary | ICD-10-CM

## 2011-06-04 MED ORDER — METRONIDAZOLE 0.75 % EX GEL: CUTANEOUS | Status: DC | PRN

## 2011-06-04 NOTE — Progress Notes (Signed)
  Presents with a complaint of vaginal discharge with an odor, fears partner has been unfaithful. Denies fever, UTI symptoms, occasional left lower quadrant pain. Mirena IUD with rare cycles. History of recurrent BV.  Exam: External genitalia is within normal , speculum exam cervix is without lesion GC/ Chlamydia culture taken and is pending, wet prep positive for BV, bimanual uterus is large 12 weeks size/fibroids. Slight tenderness on the left side.  Plan: MetroGel vaginal cream 1 applicator at bedtime x5 and one time monthly to prevent recurrence. Condoms encouraged when sexually active. Ultrasound: Multiple fibroids noted 51 x 44, 33 x 22, 32 x 25, 37 x 25, 41 x 34, 18 x 13 mm, left ovary normal right ovary is not visible but adnexa negative no free fluid. IUD in uterus Did review discomfort may be from fibroids. Reviewed options, continue with Mirena IUD, hysterectomy with ovarian conservation. She is not 100% sure she does not want a third child. Did review importance of not proceeding until 100% sure she desires no further children. If she so chooses will schedule with Dr. Audie Box to discuss surgery.

## 2011-09-13 ENCOUNTER — Encounter: Payer: Self-pay | Admitting: Women's Health

## 2011-09-13 ENCOUNTER — Ambulatory Visit (INDEPENDENT_AMBULATORY_CARE_PROVIDER_SITE_OTHER): Payer: BC Managed Care – PPO | Admitting: Women's Health

## 2011-09-13 DIAGNOSIS — N898 Other specified noninflammatory disorders of vagina: Secondary | ICD-10-CM

## 2011-09-13 DIAGNOSIS — B373 Candidiasis of vulva and vagina: Secondary | ICD-10-CM

## 2011-09-13 LAB — WET PREP FOR TRICH, YEAST, CLUE: Trich, Wet Prep: NONE SEEN

## 2011-09-13 MED ORDER — METRONIDAZOLE 0.75 % VA GEL: VAGINAL | Status: AC

## 2011-09-13 MED ORDER — FLUCONAZOLE 150 MG PO TABS: 150.0000 mg | ORAL_TABLET | Freq: Once | ORAL | Status: AC

## 2011-09-13 NOTE — Progress Notes (Signed)
Patient ID: Kendra Griffin, female   DOB: June 29, 1976, 36 y.o.   MRN: [id number] Presents with a complaint of vaginal discharge with odor and slight itching. Also has had problems with 2-3 week cycles with a Mirena IUD. History of fibroids/menorrhagia. States cycles are half the flow they were prior to the IUD but now lasting much longer. Same partner.  Exam: External genitalia within normal limits. Speculum exam moderate amount of a milky malodorous discharge adherent to the vaginal walls. IUD string was not visible. History of ultrasound with IUD in uterus. Bimanual uterus about 12 weeks size nontender.  Recurrent BV Yeast Menorrhagia/fibroids and Mirena  IUD  Plan: MetroGel vaginal cream 1 applicator at bedtime x5 and then weekly for several weeks. Diflucan 150 with refill. Yeast prevention discussed. Instructed to call if no relief of discharge. Had used birth control pills in the past without a problem. Has normal blood pressure and is a nonsmoker. 2 sample packs of generess given to take starting first day of the next cycle to help with regulation will call if no relief. Did review slight risk of blood clots and strokes with birth control pills.

## 2011-10-20 ENCOUNTER — Encounter: Payer: Self-pay | Admitting: Women's Health

## 2011-10-20 ENCOUNTER — Other Ambulatory Visit: Payer: Self-pay | Admitting: Women's Health

## 2011-10-20 ENCOUNTER — Ambulatory Visit (INDEPENDENT_AMBULATORY_CARE_PROVIDER_SITE_OTHER): Payer: BC Managed Care – PPO | Admitting: Women's Health

## 2011-10-20 VITALS — BP 122/72 | Ht 66.0 in | Wt 171.0 lb

## 2011-10-20 DIAGNOSIS — Q249 Congenital malformation of heart, unspecified: Secondary | ICD-10-CM

## 2011-10-20 DIAGNOSIS — E079 Disorder of thyroid, unspecified: Secondary | ICD-10-CM

## 2011-10-20 DIAGNOSIS — Z01419 Encounter for gynecological examination (general) (routine) without abnormal findings: Secondary | ICD-10-CM

## 2011-10-20 DIAGNOSIS — N898 Other specified noninflammatory disorders of vagina: Secondary | ICD-10-CM

## 2011-10-20 DIAGNOSIS — Z833 Family history of diabetes mellitus: Secondary | ICD-10-CM

## 2011-10-20 DIAGNOSIS — Z13220 Encounter for screening for lipoid disorders: Secondary | ICD-10-CM

## 2011-10-20 DIAGNOSIS — Z113 Encounter for screening for infections with a predominantly sexual mode of transmission: Secondary | ICD-10-CM

## 2011-10-20 LAB — LIPID PANEL
Triglycerides: 69 mg/dL (ref <150)
VLDL: 14 mg/dL (ref 0–40)

## 2011-10-20 LAB — CBC WITH DIFFERENTIAL/PLATELET
Basophils Absolute: 0 10*3/uL (ref 0.0–0.1)
Basophils Relative: 0 % (ref 0–1)
Hemoglobin: 12.1 g/dL (ref 12.0–15.0)
Lymphocytes Relative: 44 % (ref 12–46)
MCH: 28.7 pg (ref 26.0–34.0)
Monocytes Relative: 8 % (ref 3–12)
Neutrophils Relative %: 45 % (ref 43–77)
Platelets: 161 10*3/uL (ref 150–400)

## 2011-10-20 LAB — GLUCOSE, RANDOM: Glucose, Bld: 83 mg/dL (ref 70–99)

## 2011-10-20 LAB — WET PREP FOR TRICH, YEAST, CLUE
Trich, Wet Prep: NONE SEEN
WBC, Wet Prep HPF POC: NONE SEEN
Yeast Wet Prep HPF POC: NONE SEEN

## 2011-10-20 LAB — HIV ANTIBODY (ROUTINE TESTING W REFLEX): HIV: NONREACTIVE

## 2011-10-20 LAB — HEPATITIS C ANTIBODY: HCV Ab: NEGATIVE

## 2011-10-20 LAB — RPR

## 2011-10-20 MED ORDER — METRONIDAZOLE 0.75 % EX GEL: CUTANEOUS | Status: DC | PRN

## 2011-10-20 NOTE — Patient Instructions (Signed)

## 2011-10-20 NOTE — Progress Notes (Signed)
Kendra Griffin 1975-10-07 [id number]    History:    The patient presents for annual exam.  Irregular cycles with Mirena IUD placed September 2011. States had bleeding most of the month of January was placed on Loestrin 1/20 and has now been amenorrheic for greater than one month. Has had problems with recurrent BV using an applicator of MetroGel after cycle with good relief, increase in BV symptoms with irregular cycle with Mirena. Ultrasound October 2012 showed multiple fibroids  with IUD in the endometrial cavity. History of multiple myomectomy in 2005. History of normal Paps. Same partner.   Past medical history, past surgical history, family history and social history were all reviewed and documented in the EPIC chart. Daughter's Aber 3, Morrie Sheldon 7 both doing well. Father not involved with care of children. Abdominal hernia repair in 2008 and repeated in 2011.   ROS:  A  ROS was performed and pertinent positives and negatives are included in the history.  Exam:  Filed Vitals:   10/20/11 0919  BP: 122/72    General appearance:  Normal Head/Neck:  Normal, without cervical or supraclavicular adenopathy. Thyroid:  Symmetrical, normal in size, without palpable masses or nodularity. Respiratory  Effort:  Normal  Auscultation:  Clear without wheezing or rhonchi Cardiovascular  Auscultation:  Regular rate, without rubs, murmurs or gallops  Edema/varicosities:  Not grossly evident Abdominal  Soft,nontender, without masses, guarding or rebound.  Liver/spleen:  No organomegaly noted  Hernia:  None appreciated  Skin  Inspection:  Grossly normal  Palpation:  Grossly normal Neurologic/psychiatric  Orientation:  Normal with appropriate conversation.  Mood/affect:  Normal  Genitourinary    Breasts: Examined lying and sitting.     Right: Without masses, retractions, discharge or axillary adenopathy.     Left: Without masses, retractions, discharge or axillary  adenopathy.   Inguinal/mons:  Normal without inguinal adenopathy  External genitalia:  Normal  BUS/Urethra/Skene's glands:  Normal  Bladder:  Normal  Vagina:  Normal  Cervix:  Normal-IUD strings not visible  Uterus:  Enlarged in size/fibroids   Midline and mobile  Adnexa/parametria:     Rt: Without masses or tenderness.   Lt: Without masses or tenderness.  Anus and perineum: Normal  Digital rectal exam: Normal sphincter tone without palpated masses or tenderness  Assessment/Plan:  36 y.o. SBF G3 P2  for annual exam with complaint of weight, discharge and requesting STD screening.  Mirena IUD 9/11 Fibroid uterus Recurrent BV STD screening  Plan: SBE's, exercise, calcium rich diet, MVI daily encouraged. MetroGel vaginal cream 1 applicator at bedtime x5 and then monthly as needed. Instructed to call office if problems with irregular cycles. Encouraged to continue condoms. Encouraged to increase exercise and decrease calories for weight loss. ACOG's recommendations for Pap screening was reviewed. CBC, lipid profile, TSH, GC/Chlamydia, HIV, hepatitis B and C., RPRHarrington Challenger WHNP, 10:17 AM 10/20/2011

## 2011-10-21 LAB — THYROID PROFILE - CHCC
T3 Uptake: 44.5 % — ABNORMAL HIGH (ref 22.5–37.0)
T4, Total: 27 ug/dL — ABNORMAL HIGH (ref 5.0–12.5)

## 2011-10-25 ENCOUNTER — Telehealth: Payer: Self-pay | Admitting: *Deleted

## 2011-10-25 NOTE — Telephone Encounter (Signed)
Message copied by Richardson Chiquito on Mon Oct 25, 2011  3:46 PM ------      Message from: [place], [place] J      Created: Thu Oct 21, 2011  3:04 PM       Amy, please schedule appointment with Dr. Talmage Nap for hyperthyroidism, can go any day except Tuesday- mornings best. I sent lab result information to you also.

## 2011-10-25 NOTE — Telephone Encounter (Signed)
Pt informed of appt with Dr Talmage Nap 12/07/11 at 1030. Pt will have to call them to have the appt changed she cannot go on Tuesday mornings. I apologized to the patient for this. She will call them. Records faxed by Amy previously.

## 2011-12-09 ENCOUNTER — Other Ambulatory Visit (HOSPITAL_COMMUNITY): Payer: Self-pay | Admitting: Endocrinology

## 2011-12-09 DIAGNOSIS — E0590 Thyrotoxicosis, unspecified without thyrotoxic crisis or storm: Secondary | ICD-10-CM

## 2011-12-16 ENCOUNTER — Ambulatory Visit (HOSPITAL_COMMUNITY): Payer: BC Managed Care – PPO

## 2011-12-17 ENCOUNTER — Encounter (HOSPITAL_COMMUNITY): Payer: BC Managed Care – PPO

## 2011-12-23 ENCOUNTER — Encounter (HOSPITAL_COMMUNITY)
Admission: RE | Admit: 2011-12-23 | Discharge: 2011-12-23 | Disposition: A | Discharge status: Home / Self Care | Payer: BC Managed Care – PPO | Source: Ambulatory Visit | Attending: Endocrinology | Admitting: Endocrinology

## 2011-12-23 DIAGNOSIS — E0590 Thyrotoxicosis, unspecified without thyrotoxic crisis or storm: Secondary | ICD-10-CM | POA: Insufficient documentation

## 2011-12-24 ENCOUNTER — Encounter (HOSPITAL_COMMUNITY)
Admission: RE | Admit: 2011-12-24 | Discharge: 2011-12-24 | Disposition: A | Discharge status: Home / Self Care | Payer: BC Managed Care – PPO | Source: Ambulatory Visit | Attending: Endocrinology | Admitting: Endocrinology

## 2011-12-24 MED ORDER — SODIUM PERTECHNETATE TC 99M INJECTION
10.7000 | Freq: Once | INTRAVENOUS | Status: AC | PRN
  Administered 2011-12-24: 11 via INTRAVENOUS

## 2011-12-28 ENCOUNTER — Other Ambulatory Visit: Payer: Self-pay | Admitting: Endocrinology

## 2011-12-28 DIAGNOSIS — E0500 Thyrotoxicosis with diffuse goiter without thyrotoxic crisis or storm: Secondary | ICD-10-CM

## 2012-01-07 ENCOUNTER — Ambulatory Visit (INDEPENDENT_AMBULATORY_CARE_PROVIDER_SITE_OTHER): Payer: BC Managed Care – PPO | Admitting: Gynecology

## 2012-01-07 ENCOUNTER — Encounter: Payer: Self-pay | Admitting: Gynecology

## 2012-01-07 ENCOUNTER — Other Ambulatory Visit: Payer: Self-pay | Admitting: Gynecology

## 2012-01-07 ENCOUNTER — Encounter (HOSPITAL_COMMUNITY)
Admission: RE | Admit: 2012-01-07 | Discharge: 2012-01-07 | Disposition: A | Discharge status: Home / Self Care | Payer: BC Managed Care – PPO | Source: Ambulatory Visit | Attending: Endocrinology | Admitting: Endocrinology

## 2012-01-07 DIAGNOSIS — R350 Frequency of micturition: Secondary | ICD-10-CM

## 2012-01-07 DIAGNOSIS — N898 Other specified noninflammatory disorders of vagina: Secondary | ICD-10-CM

## 2012-01-07 DIAGNOSIS — N766 Ulceration of vulva: Secondary | ICD-10-CM

## 2012-01-07 DIAGNOSIS — L293 Anogenital pruritus, unspecified: Secondary | ICD-10-CM

## 2012-01-07 DIAGNOSIS — IMO0001 Reserved for inherently not codable concepts without codable children: Secondary | ICD-10-CM

## 2012-01-07 DIAGNOSIS — N390 Urinary tract infection, site not specified: Secondary | ICD-10-CM

## 2012-01-07 DIAGNOSIS — Z113 Encounter for screening for infections with a predominantly sexual mode of transmission: Secondary | ICD-10-CM

## 2012-01-07 DIAGNOSIS — E0500 Thyrotoxicosis with diffuse goiter without thyrotoxic crisis or storm: Secondary | ICD-10-CM | POA: Insufficient documentation

## 2012-01-07 LAB — URINALYSIS W MICROSCOPIC + REFLEX CULTURE
Hgb urine dipstick: NEGATIVE
Ketones, ur: NEGATIVE mg/dL
Nitrite: POSITIVE — AB
Specific Gravity, Urine: 1.02 (ref 1.005–1.030)
Urobilinogen, UA: 1 mg/dL (ref 0.0–1.0)
pH: 7 (ref 5.0–8.0)

## 2012-01-07 LAB — HCG, SERUM, QUALITATIVE: Preg, Serum: NEGATIVE

## 2012-01-07 LAB — WET PREP FOR TRICH, YEAST, CLUE: Yeast Wet Prep HPF POC: NONE SEEN

## 2012-01-07 MED ORDER — CIPROFLOXACIN HCL 250 MG PO TABS: 250.0000 mg | ORAL_TABLET | Freq: Two times a day (BID) | ORAL | Status: AC

## 2012-01-07 MED ORDER — TINIDAZOLE 500 MG PO TABS: 2.0000 g | ORAL_TABLET | Freq: Once | ORAL | Status: AC

## 2012-01-07 NOTE — Progress Notes (Deleted)
Patient ID: Kendra Griffin, female   DOB: [birth year], 36 y.o.   MRN: [id number] Patient presents with one-week history of darker urine and a little more frequency. Also a vaginal discharge with some itching and she thinks that she has a yeast infection.  Exam with Sherrilyn Rist chaperone present Pelvic external with cluster of several small punctate ulcers left upper perineal body inferior to posterior fourchette with mild surrounding erythema and swelling consistent with HSV. Negative inguinal adenopathy.  Vagina with white discharge. Cervix grossly normal. Uterus enlarged consistent with leiomyoma. Adnexa without masses or tenderness.   Physical Exam  Genitourinary:       Assessment and plan: 1. UTI. Symptoms and urinalysis consistent with UTI. Will cover with ciprofloxacin 250 mg twice a day x3 days. Follow up if symptoms persist or recur. 2. White discharge. Wet prep is positive for BV. Will treat with Tindamax 2 g daily x2 days, alcohol avoidance reviewed. 3. HSV-2. Exam extremely suspicious for HSV-2. On questioning she has recurrent irritation in this area several times per year that she has assumed that it was a yeast infection in the past. It is a classic appearance for HSV-2. I did a PCR screen. I reviewed with her even if negative my clinical suspicion is high and that if she does have a recurrence to come in so we can recheck this area. It does come back positive the options for intermittent Valtrex episodically versus daily suppressive dose reviewed. I reviewed the daily suppressive dose not only to decrease recurrences but also to decrease transmission to partners. Patient will think of her options and decide if her screen does return positive. If these areas do not clear spontaneously she knows to represent for further evaluation.

## 2012-01-07 NOTE — Patient Instructions (Signed)
Office will call you with culture results. 

## 2012-01-07 NOTE — Progress Notes (Signed)
Patient ID: Kendra Griffin, female   DOB: May 08, 1976, 36 y.o.   MRN: [id number] Patient presents with one-week history of darker urine and a little more frequency. Also a vaginal discharge with some itching and she thinks that she has a yeast infection.  Exam with Sherrilyn Rist chaperone present Pelvic external with cluster of several small punctate ulcers left upper perineal body inferior to posterior fourchette with mild surrounding erythema and swelling consistent with HSV. Negative inguinal adenopathy.  Vagina with white discharge. Cervix grossly normal. Uterus enlarged consistent with leiomyoma. Adnexa without masses or tenderness.   Physical Exam  Genitourinary:          Assessment and plan: 1. UTI. Symptoms and urinalysis consistent with UTI. Will cover with ciprofloxacin 250 mg twice a day x3 days. Follow up if symptoms persist or recur. 2. White discharge. Wet prep is positive for BV. Will treat with Tindamax 2 g daily x2 days, alcohol avoidance reviewed. 3. HSV-2. Exam extremely suspicious for HSV-2. On questioning she has recurrent irritation in this area several times per year that she has assumed that it was a yeast infection in the past. It is a classic appearance for HSV-2. I did a PCR screen. I reviewed with her even if negative my clinical suspicion is high and that if she does have a recurrence to come in so we can recheck this area. It does come back positive the options for intermittent Valtrex episodically versus daily suppressive dose reviewed. I reviewed the daily suppressive dose not only to decrease recurrences but also to decrease transmission to partners. Patient will think of her options and decide if her screen does return positive. If these areas do not clear spontaneously she knows to represent for further evaluation. 4. Enlarged uterus consistent with leiomyoma. Ultrasound in the past has documented presence of leiomyoma. Exam stable from prior exams. 5. IUD  contraception. IUD string was not overtly visible but has been documented to be intrauterine by ultrasound.

## 2012-01-08 LAB — GC/CHLAMYDIA PROBE AMP, GENITAL: Chlamydia, DNA Probe: NEGATIVE

## 2012-01-12 ENCOUNTER — Other Ambulatory Visit: Payer: Self-pay | Admitting: Gynecology

## 2012-01-12 ENCOUNTER — Encounter (HOSPITAL_COMMUNITY)
Admission: RE | Admit: 2012-01-12 | Discharge: 2012-01-12 | Disposition: A | Discharge status: Home / Self Care | Payer: BC Managed Care – PPO | Source: Ambulatory Visit | Attending: Endocrinology | Admitting: Endocrinology

## 2012-01-12 ENCOUNTER — Encounter: Payer: Self-pay | Admitting: Gynecology

## 2012-01-12 LAB — HCG, SERUM, QUALITATIVE: Preg, Serum: NEGATIVE

## 2012-01-12 MED ORDER — VALACYCLOVIR HCL 500 MG PO TABS: 500.0000 mg | ORAL_TABLET | Freq: Two times a day (BID) | ORAL | Status: AC

## 2012-01-12 MED ORDER — SODIUM IODIDE I 131 CAPSULE
12.8000 | Freq: Once | INTRAVENOUS | Status: AC | PRN
  Administered 2012-01-12: 12.8 via ORAL

## 2012-01-12 NOTE — Addendum Note (Signed)
Addended by: Dara Lords on: 01/12/2012 02:10 PM   Modules accepted: Orders

## 2012-10-20 ENCOUNTER — Ambulatory Visit (INDEPENDENT_AMBULATORY_CARE_PROVIDER_SITE_OTHER): Payer: BC Managed Care – PPO | Admitting: Women's Health

## 2012-10-20 ENCOUNTER — Other Ambulatory Visit (HOSPITAL_COMMUNITY)
Admission: RE | Admit: 2012-10-20 | Discharge: 2012-10-20 | Disposition: A | Discharge status: Home / Self Care | Payer: BC Managed Care – PPO | Source: Ambulatory Visit | Attending: Obstetrics and Gynecology | Admitting: Obstetrics and Gynecology

## 2012-10-20 ENCOUNTER — Encounter: Payer: Self-pay | Admitting: Women's Health

## 2012-10-20 VITALS — BP 134/84 | Ht 66.0 in | Wt 176.0 lb

## 2012-10-20 DIAGNOSIS — Z01419 Encounter for gynecological examination (general) (routine) without abnormal findings: Secondary | ICD-10-CM | POA: Insufficient documentation

## 2012-10-20 DIAGNOSIS — D219 Benign neoplasm of connective and other soft tissue, unspecified: Secondary | ICD-10-CM

## 2012-10-20 DIAGNOSIS — R82998 Other abnormal findings in urine: Secondary | ICD-10-CM

## 2012-10-20 DIAGNOSIS — R8290 Unspecified abnormal findings in urine: Secondary | ICD-10-CM

## 2012-10-20 DIAGNOSIS — A499 Bacterial infection, unspecified: Secondary | ICD-10-CM

## 2012-10-20 DIAGNOSIS — D259 Leiomyoma of uterus, unspecified: Secondary | ICD-10-CM

## 2012-10-20 DIAGNOSIS — N760 Acute vaginitis: Secondary | ICD-10-CM

## 2012-10-20 DIAGNOSIS — B009 Herpesviral infection, unspecified: Secondary | ICD-10-CM

## 2012-10-20 DIAGNOSIS — Z30430 Encounter for insertion of intrauterine contraceptive device: Secondary | ICD-10-CM

## 2012-10-20 DIAGNOSIS — E0590 Thyrotoxicosis, unspecified without thyrotoxic crisis or storm: Secondary | ICD-10-CM

## 2012-10-20 DIAGNOSIS — B9689 Other specified bacterial agents as the cause of diseases classified elsewhere: Secondary | ICD-10-CM

## 2012-10-20 LAB — THYROID PANEL WITH TSH
Free Thyroxine Index: 3 (ref 1.0–3.9)
T3 Uptake: 29.3 % (ref 22.5–37.0)
T4, Total: 10.4 ug/dL (ref 5.0–12.5)
TSH: 0.972 u[IU]/mL (ref 0.350–4.500)

## 2012-10-20 MED ORDER — METRONIDAZOLE 0.75 % VA GEL: VAGINAL | Status: DC

## 2012-10-20 NOTE — Addendum Note (Signed)
Addended by: Richardson Chiquito on: 10/20/2012 02:11 PM   Modules accepted: Orders

## 2012-10-20 NOTE — Progress Notes (Signed)
Kendra Griffin [birth year] [id number]    History:    The patient presents for annual exam.  Mirena IUD placed 03/2010. Numerous fibroids/irregular periods with menorrhagia. Myomectomy 2005.History of normal Paps. Hyperthyroid had iodine treatment 2013 per Dr. Talmage Nap, normal TSH December 2013. Recurrent BV uses one applicator of MetroGel after cycle with good relief of symptoms. History of HSV-2 with rare outbreaks.   Past medical history, past surgical history, family history and social history were all reviewed and documented in the EPIC chart. States has not felt as well since hypothyroidism was treated with iodine, occasionally has a symptom of feeling like she is going to pass out with laughing, irregular cycles. Hospital doctor for, [place] [facility] both doing well. Umbilical hernia repaired in 2008, and 2011.   ROS:  A  ROS was performed and pertinent positives and negatives are included in the history.  Exam:  Filed Vitals:   10/20/12 1209  BP: 134/84    General appearance:  Normal Head/Neck:  Normal, without cervical or supraclavicular adenopathy. Thyroid:  Symmetrical, normal in size, without palpable masses or nodularity. Respiratory  Effort:  Normal  Auscultation:  Clear without wheezing or rhonchi Cardiovascular  Auscultation:  Regular rate, without rubs, murmurs or gallops  Edema/varicosities:  Not grossly evident Abdominal  Soft,nontender, without masses, guarding or rebound.  Liver/spleen:  No organomegaly noted  Hernia:  None appreciated  Skin  Inspection:  Grossly normal  Palpation:  Grossly normal Neurologic/psychiatric  Orientation:  Normal with appropriate conversation.  Mood/affect:  Normal  Genitourinary    Breasts: Examined lying and sitting.     Right: Without masses, retractions, discharge or axillary adenopathy.     Left: Without masses, retractions, discharge or axillary adenopathy.   Inguinal/mons:  Normal without inguinal adenopathy  External genitalia:   Normal  BUS/Urethra/Skene's glands:  Normal  Bladder:  Normal  Vagina:  Normal  Cervix:  Normal/IUD string not visible  Uterus:  Fibroid uterus 12 weeks' size.  Midline and mobile  Adnexa/parametria:     Rt: Without masses or tenderness.   Lt: Without masses or tenderness.  Anus and perineum: Normal  Digital rectal exam: Normal sphincter tone without palpated masses or tenderness  Assessment/Plan:  37 y.o. SBF G3P2 for annual exam.    Hyperthyroidism treated with iodine per Dr. Talmage Nap 2013 Mirena IUD placed 08/2009 with irregular cycles HSV 2 rare outbreaks Fibroids Recurrent BV-MetroGel vaginal cream 1 applicator after her menstrual cycle with good relief of symptoms.  Plan: Ultrasound, reviewed possible hysterectomy due to the recurrent fibroids, menorrhagia, does not desire more children. IUD string not visible in the past, seen with ultrasound. SBE's, continue regular exercise, calcium rich diet, vitamin D 1000 daily encouraged. MetroGel vaginal cream 1 applicator at bedtime after menstrual cycle prescription given. Instructed to call if problems. Options for irregular cycle with menorrhagia discussed, will try lo Loestrin daily with next 2 cycles, risk for blood clots and strokes reviewed. No prescription given will call if problems or good relief. Valtrex 500 twice daily for 3-5 days as needed. UA, Thyroid panel, will take results to Dr. Talmage Nap at next scheduled appointment. Pap normal 2012, Pap today, reviewed new screening guidelines.    Harrington Challenger WHNP, 1:01 PM 10/20/2012

## 2012-10-20 NOTE — Patient Instructions (Signed)
Uterine Fibroid  A uterine fibroid is a growth (tumor) that occurs in a woman's uterus. This type of tumor is not cancerous and does not spread out of the uterus. A woman can have one or many fibroids, and the fiboid(s) can become quite large. A fibroid can vary in size, weight, and where it grows in the uterus. Most fibroids do not require medical treatment, but some can cause pain or heavy bleeding during and between periods.  CAUSES   A fibroid is the result of a single uterine cell that keeps growing (unregulated), which is different than most cells in the human body. Most cells have a control mechanism that keeps them from reproducing without control.   SYMPTOMS    Bleeding.   Pelvic pain and pressure.   Bladder problems due to the size of the fibroid.   Infertility and miscarriages depending on the size and location of the fibroid.  DIAGNOSIS   A diagnosis is made by physical exam. Your caregiver may feel the lumpy tumors during a pelvic exam. Important information regarding size, location, and number of tumors can be gained by having an ultrasound. It is rare that other tests, such as a CT scan or MRI, are needed.  TREATMENT    Your caregiver may recommend watchful waiting. This involves getting the fibroid checked by your caregiver to see if the fibroids grow or shrink.    Hormonal treatment or an intrauterine device (IUD) may be prescribed.    Surgery may be needed to remove the fibroids (myomectomy) or the uterus (hysterectomy). This depends on your situation.  When fibroids interfere with fertility and a woman wants to become pregnant, a caregiver may recommend having the fibroids removed.   HOME CARE INSTRUCTIONS   Home care depends on how you were treated. In general:    Keep all follow-up appointments with your caregiver.    Only take medicine as told by your caregiver. Do not take aspirin. It can cause bleeding.    If you have excessive periods and soak tampons or pads in a half hour or  less, contact your caregiver immediately. If your periods are troublesome but not so heavy, lie down with your feet raised slightly above your heart. Place cold packs on your lower abdomen.    If your periods are heavy, write down the number of pads or tampons you use per month. Bring this information to your caregiver.    Talk to your caregiver about taking iron pills.    Include green vegetables in your diet.    If you were prescribed a hormonal treatment, take the hormonal medicines as directed.    If you need surgery, ask your caregiver for information on your specific surgery.   SEEK IMMEDIATE MEDICAL CARE IF:   You have pelvic pain or cramps not controlled with medicines.    You have a sudden increase in pelvic pain.    You have an increase of bleeding between and during periods.    You feel lightheaded or have fainting episodes.   MAKE SURE YOU:   Understand these instructions.   Will watch your condition.   Will get help right away if you are not doing well or get worse.  Document Released: 07/09/2000 Document Revised: 10/04/2011 Document Reviewed: 08/02/2011  ExitCare Patient Information 2013 [employer].

## 2012-10-21 LAB — URINALYSIS W MICROSCOPIC + REFLEX CULTURE
Crystals: NONE SEEN
Ketones, ur: NEGATIVE mg/dL
Nitrite: POSITIVE — AB
Specific Gravity, Urine: 1.016 (ref 1.005–1.030)
Urobilinogen, UA: 0.2 mg/dL (ref 0.0–1.0)

## 2012-10-23 ENCOUNTER — Other Ambulatory Visit: Payer: Self-pay | Admitting: Women's Health

## 2012-10-23 MED ORDER — CIPROFLOXACIN HCL 500 MG PO TABS: 500.0000 mg | ORAL_TABLET | Freq: Two times a day (BID) | ORAL | Status: DC

## 2012-10-30 ENCOUNTER — Other Ambulatory Visit: Payer: Self-pay | Admitting: Women's Health

## 2012-10-30 ENCOUNTER — Ambulatory Visit (INDEPENDENT_AMBULATORY_CARE_PROVIDER_SITE_OTHER): Payer: BC Managed Care – PPO

## 2012-10-30 ENCOUNTER — Ambulatory Visit (INDEPENDENT_AMBULATORY_CARE_PROVIDER_SITE_OTHER): Payer: BC Managed Care – PPO | Admitting: Women's Health

## 2012-10-30 ENCOUNTER — Encounter: Payer: Self-pay | Admitting: Women's Health

## 2012-10-30 DIAGNOSIS — D252 Subserosal leiomyoma of uterus: Secondary | ICD-10-CM

## 2012-10-30 DIAGNOSIS — N938 Other specified abnormal uterine and vaginal bleeding: Secondary | ICD-10-CM

## 2012-10-30 DIAGNOSIS — D219 Benign neoplasm of connective and other soft tissue, unspecified: Secondary | ICD-10-CM

## 2012-10-30 DIAGNOSIS — N8300 Follicular cyst of ovary, unspecified side: Secondary | ICD-10-CM

## 2012-10-30 DIAGNOSIS — D259 Leiomyoma of uterus, unspecified: Secondary | ICD-10-CM

## 2012-10-30 DIAGNOSIS — N852 Hypertrophy of uterus: Secondary | ICD-10-CM

## 2012-10-30 DIAGNOSIS — D251 Intramural leiomyoma of uterus: Secondary | ICD-10-CM

## 2012-10-30 DIAGNOSIS — N949 Unspecified condition associated with female genital organs and menstrual cycle: Secondary | ICD-10-CM

## 2012-10-30 NOTE — Progress Notes (Signed)
Patient ID: Kendra Griffin, female   DOB: 02-15-1976, 37 y.o.   MRN: [id number] Presents for ultrasound, enlarged uterus at annual exam. History of a myomectomy 2005. Ultrasound 2012, IUD noted, numerous fibroids, uterus length 108 mm.  Menorrhagia, irregular periods, low abdominal pressure sensation at times. Mirena IUD placed 2011.  Ultrasound: Enlarged anteverted uterus with numerous subserous and intramural fibroids 36 x 30, 33 x 26, 35 x 39, 29 x 21, 28 x 25, 53 x 47, 41 x 30, 53x36mm. Endometrium prominent with calcifications wall of the endometrium, Unable to ID IUD due to fibroids.. Right and left ovary normal. Negative cul-de-sac.  Fibroid uterus with probable IUD in uterus/unable to positively identify - numerous fibroids  Plan: Options reviewed, hysterectomy discussed. Handout given regarding hysterectomy and fibroids. If chooses surgical route instructed to schedule appointment with Dr. Audie Box to discuss. Condoms encouraged.

## 2013-01-01 ENCOUNTER — Telehealth: Payer: Self-pay | Admitting: *Deleted

## 2013-01-01 NOTE — Telephone Encounter (Signed)
Pt asked to speak with you about last office visit regarding question about hysterectomy and fibroids.  Call back # [phone].

## 2013-01-01 NOTE — Telephone Encounter (Signed)
Telephone call to review, states that she would like to proceed with a hysterectomy with Dr. Audie Box. Will schedule an appointment. Fibroids with menorrhagia/dysmenorrhea.

## 2013-01-18 ENCOUNTER — Encounter: Payer: Self-pay | Admitting: Gynecology

## 2013-01-18 ENCOUNTER — Ambulatory Visit (INDEPENDENT_AMBULATORY_CARE_PROVIDER_SITE_OTHER): Payer: BC Managed Care – PPO | Admitting: Gynecology

## 2013-01-18 DIAGNOSIS — IMO0002 Reserved for concepts with insufficient information to code with codable children: Secondary | ICD-10-CM

## 2013-01-18 DIAGNOSIS — N926 Irregular menstruation, unspecified: Secondary | ICD-10-CM

## 2013-01-18 DIAGNOSIS — D251 Intramural leiomyoma of uterus: Secondary | ICD-10-CM

## 2013-01-18 NOTE — Patient Instructions (Signed)
Office will contact you to arrange for surgery.

## 2013-01-18 NOTE — Progress Notes (Signed)
Patient presents in followup having recently seen Harriett Sine. Her menses of the past year have become prolonged where she is bleeding 3 weeks out of 4 weeks. Noticing some increased cramping with her menses as well as consistent deep dyspareunia as far as something being hit. Had ultrasound which showed multiple myomas. Her IUD was not clearly visualized within the cavity. Had been documented previously within the cavity since placement with ultrasound.  Exam was Berenice Bouton Abdomen soft nontender without masses guarding rebound organomegaly. Pelvic external BUS vagina normal. Cervix normal. Uterus 12-14 weeks size irregular consistent with leiomyoma. Adnexa without gross masses or tenderness.  Assessment and plan: Leiomyoma becoming symptomatic with irregular bleeding and pain with intercourse. Prior history of myomectomy. Cesarean section x2. Options for management reviewed to include hormonal manipulation, myomectomy, uterine artery embolization, hysterectomy. Patient had trial of oral contraceptives but continued to bleed irregularly despite that. Has IUD and apparently is failing that also. Patient has already thought of this and she wants to proceed with definitive therapy to include hysterectomy. Childbearing is no longer an issue and she is not interested in myomectomy or uterine artery embolization. Very conservation was discussed and she desires to keep both ovaries. I discussed issues of continued hormone production but also risk of disease in the future to include benign requiring reoperation as well as ovarian cancer. Her uterus is generous in no prior vaginal deliveries. My recommendation would be to consider Depo-Lupron in an attempt to shrink her uterus enough to allow for vaginal delivery and operate at the 8-12 week window. He clearly understands even with Depo-Lupron she is at risk for abdominal hysterectomy given her prior myomectomy and potential for scarring as well as her cesarean section  x2. Patient wants to proceed with surgery we'll go ahead and make the plans and will move toward scheduling the surgery. He IUD placement issue was also reviewed. She'll more than likely is within the uterus but difficult to view due to the myomas. Will start with flatplate to see if we can't visualize it in the pelvis. Alternative would be to attempt to blindly remove it in the office. We'll start with flatplate and then go from there.

## 2013-01-19 ENCOUNTER — Telehealth: Payer: Self-pay | Admitting: *Deleted

## 2013-01-19 DIAGNOSIS — D251 Intramural leiomyoma of uterus: Secondary | ICD-10-CM

## 2013-01-19 NOTE — Telephone Encounter (Signed)
Message copied by Aura Camps on Fri Jan 19, 2013  9:25 AM ------      Message from: Keenan Bachelor      Created: Fri Jan 19, 2013  9:12 AM      Regarding: Radiology appt       Per Dr. Velvet Bathe Patient "Needs flat plate of abdomen re: IUD difficult to see on ultrasound due to leiomyoma flatplate to localize IUD in pelvis" ------

## 2013-01-19 NOTE — Telephone Encounter (Signed)
Pt can walk in at [facility] hospital to have done anytime between hours of 7 am-5 pm. Kendra Griffin will informed pt this because she has speak with her about Lupron information.

## 2013-01-19 NOTE — Telephone Encounter (Signed)
Order placed for the below.

## 2013-01-19 NOTE — Telephone Encounter (Signed)
I spoke with patient as well. Instructed her regarding walk-in 7am-5pm at [facility] for x-ray.  Also, discussed ins benefits for surgery and her financial responsibility.  We discussed Lupron and I explained order faxed and [employer] will be contacting her with ins benefits and to give them ok to ship.  She will await their call.  She is considering week of Sept 15 or 22 for surgery.  I will call her when Lupron arrives.

## 2013-01-25 ENCOUNTER — Ambulatory Visit (HOSPITAL_COMMUNITY)
Admission: RE | Admit: 2013-01-25 | Discharge: 2013-01-25 | Disposition: A | Discharge status: Home / Self Care | Payer: BC Managed Care – PPO | Source: Ambulatory Visit | Attending: Gynecology | Admitting: Gynecology

## 2013-01-25 DIAGNOSIS — D251 Intramural leiomyoma of uterus: Secondary | ICD-10-CM

## 2013-01-25 DIAGNOSIS — Z975 Presence of (intrauterine) contraceptive device: Secondary | ICD-10-CM | POA: Insufficient documentation

## 2013-01-25 DIAGNOSIS — D259 Leiomyoma of uterus, unspecified: Secondary | ICD-10-CM | POA: Insufficient documentation

## 2013-01-31 ENCOUNTER — Ambulatory Visit (INDEPENDENT_AMBULATORY_CARE_PROVIDER_SITE_OTHER): Payer: BC Managed Care – PPO | Admitting: Women's Health

## 2013-01-31 ENCOUNTER — Encounter: Payer: Self-pay | Admitting: Women's Health

## 2013-01-31 ENCOUNTER — Telehealth: Payer: Self-pay

## 2013-01-31 DIAGNOSIS — Z113 Encounter for screening for infections with a predominantly sexual mode of transmission: Secondary | ICD-10-CM

## 2013-01-31 DIAGNOSIS — N898 Other specified noninflammatory disorders of vagina: Secondary | ICD-10-CM

## 2013-01-31 DIAGNOSIS — A499 Bacterial infection, unspecified: Secondary | ICD-10-CM

## 2013-01-31 DIAGNOSIS — D259 Leiomyoma of uterus, unspecified: Secondary | ICD-10-CM

## 2013-01-31 DIAGNOSIS — N760 Acute vaginitis: Secondary | ICD-10-CM

## 2013-01-31 LAB — WET PREP FOR TRICH, YEAST, CLUE
Trich, Wet Prep: NONE SEEN
Yeast Wet Prep HPF POC: NONE SEEN

## 2013-01-31 MED ORDER — LEUPROLIDE ACETATE (3 MONTH) 11.25 MG IM KIT
11.2500 mg | PACK | Freq: Once | INTRAMUSCULAR | Status: AC
  Administered 2013-01-31: 11.25 mg via INTRAMUSCULAR

## 2013-01-31 MED ORDER — METRONIDAZOLE 0.75 % VA GEL: VAGINAL | Status: DC

## 2013-01-31 NOTE — Progress Notes (Signed)
Patient ID: Kendra Griffin, female   DOB: 12-Oct-1975, 37 y.o.   MRN: [id number] Dense with complaint of vaginal discharge with odor, same partner questions fidelity. Has had problems with recurrent bacterial vaginosis.  Scheduled for hysterectomy due to fibroids/menorrhagia with Dr. Audie Box September 22.  Exam: Appears well, external genitalia within normal limits, speculum exam scant discharge with no noted odor, wet prep positive for amines, clues, and TNTC bacteria. GC/Chlamydia culture taken. Bimanual uterus enlarged 14 week size,nontender.  Bacteria vaginosis STD screen Fibroid uterus  Plan: MetroGel vaginal cream 1 applicator at bedtime x5. Will continue one applicator after menses each month which has helped decrease recurrence. GC/Chlamydia culture pending. Declines need for HIV, hepatitis or RPR. Lupron given today.

## 2013-01-31 NOTE — Addendum Note (Signed)
Addended by: Richardson Chiquito on: 01/31/2013 04:43 PM   Modules accepted: Orders

## 2013-01-31 NOTE — Telephone Encounter (Signed)
I called patient to let her know her Lupron has just arrived. Call me to schedule appt to receive inj

## 2013-02-01 LAB — GC/CHLAMYDIA PROBE AMP: CT Probe RNA: NEGATIVE

## 2013-03-27 ENCOUNTER — Encounter (HOSPITAL_COMMUNITY): Payer: Self-pay | Admitting: Pharmacist

## 2013-04-09 ENCOUNTER — Ambulatory Visit (INDEPENDENT_AMBULATORY_CARE_PROVIDER_SITE_OTHER): Payer: BC Managed Care – PPO | Admitting: Gynecology

## 2013-04-09 ENCOUNTER — Encounter (HOSPITAL_COMMUNITY)
Admission: RE | Admit: 2013-04-09 | Discharge: 2013-04-09 | Disposition: A | Discharge status: Home / Self Care | Payer: BC Managed Care – PPO | Source: Ambulatory Visit | Attending: Gynecology | Admitting: Gynecology

## 2013-04-09 ENCOUNTER — Encounter: Payer: Self-pay | Admitting: Gynecology

## 2013-04-09 ENCOUNTER — Encounter (HOSPITAL_COMMUNITY): Payer: Self-pay

## 2013-04-09 DIAGNOSIS — Z01818 Encounter for other preprocedural examination: Secondary | ICD-10-CM | POA: Insufficient documentation

## 2013-04-09 DIAGNOSIS — D251 Intramural leiomyoma of uterus: Secondary | ICD-10-CM

## 2013-04-09 DIAGNOSIS — Z01812 Encounter for preprocedural laboratory examination: Secondary | ICD-10-CM | POA: Insufficient documentation

## 2013-04-09 DIAGNOSIS — IMO0002 Reserved for concepts with insufficient information to code with codable children: Secondary | ICD-10-CM

## 2013-04-09 HISTORY — DX: Hypothyroidism, unspecified: E03.9

## 2013-04-09 LAB — CBC
Hemoglobin: 12 g/dL (ref 12.0–15.0)
MCH: 29.9 pg (ref 26.0–34.0)
MCHC: 34 g/dL (ref 30.0–36.0)
Platelets: 150 10*3/uL (ref 150–400)
RBC: 4.02 MIL/uL (ref 3.87–5.11)

## 2013-04-09 NOTE — Progress Notes (Signed)
Kendra Griffin Jan 22, 1976 [id number]   Preoperative consult  Chief complaint: Menorrhagia, prolonged bleeding, dyspareunia, leiomyoma  History of present illness: 37 y.o. [id number] with history of worsening menses bleeding up to 3 weeks at a time, heavy, significant dysmenorrhea and consistent deep dyspareunia. Has Mirena IUD but bleeding is worsening despite this. Ultrasound showed an enlarged uterus 12 weeks size with multiple leiomyoma up to 6 cm. Options for management to include hormonal manipulation, uterine artery embolization, myomectomy and hysterectomy were reviewed. Prior trial of oral contraceptives which failed and subsequent Mirena IUD which now is failing. History of 2 prior cesarean sections and abdominal myomectomy.  Received Depo-Lupron end of June 2014 to attempt shrinkage of her leiomyoma and is now at the 10-12 week window and is admitted for attempted laparoscopic assisted hysterectomy, possible TAH.   Past medical history,surgical history, medications, allergies, family history and social history were all reviewed and documented in the EPIC chart. ROS:  Was performed and pertinent positives and negatives are included in the history of present illness.  Exam:  Kim assistant General: well developed, well nourished female, no acute distress HEENT: normal  Lungs: clear to auscultation without wheezing, rales or rhonchi  Cardiac: regular rate without rubs, murmurs or gallops  Abdomen: soft, nontender without masses, guarding, rebound, organomegaly  Pelvic: external bus vagina: normal   Cervix: grossly normal  Uterus: 8 weeks size irregular consistent with pedunculated/sub-serosal leiomyoma. Mobile, nontender.  Adnexa: without masses or tenderness      Assessment/Plan:  37 y.o. [id number] with leiomyoma, prolonged heavy menses, dysmenorrhea and consistent deep dyspareunia. Options for management reviewed and the patient wants to proceed with hysterectomy. Has been on Depo-Lupron  for 10+ weeks and her uterus has shrunk in response. We'll tentatively plan laparoscopic approach but discussed with patient with past history of myomectomy, C-section x2 and hernia repairs increased possibilities I will convert to an abdominal approach with larger incision and longer recovery.  Patient clearly understands and accepts this.  The expected intraoperative and postoperative courses as well as the recovery period was discussed with her. The ovarian conservation issue was discussed and the patient does want to keep both ovaries for continued hormone production. The benefits as far as symptom relief, cardiovascular and bone health reviewed versus the long-term risks of ovarian cancer and benign ovarian disease causing pain or other problems requiring reoperation discussed.  The patient does give me permission to remove one or both ovaries at my intraoperative decision if significant disease encountered.  The absolute irreversible sterility of hysterectomy was reviewed. Sexuality following hysterectomy was discussed to include the potential for persistent dyspareunia and orgasmic dysfunction following the procedure. The risks of infection, prolonged antibiotics, abscess/hematoma formation requiring reoperation and drainage were reviewed. Risk of hemorrhage necessitating transfusion the risks of transfusion discussed to include transfusion reaction, hepatitis, HIV, mad cow disease and other unknown entities. Incisional complications requiring opening and draining of incisions, closure by secondary intention, long-term issues of keloid/cosmetics and hernia formation all discussed. The potential for internal organ damage including bowel, bladder, ureters, nerves were reviewed. Increased risk with her history of prior surgeries discussed. Potential for more extensive reparative surgery in future. Surgeries to include ostomy formation, bowel resection, bladder repair, ureteral damage repair was all discussed  understood and accepted. The patient's questions are answered to her satisfaction she is ready to proceed with surgery.   Note: This document was prepared with digital dictation and possible smart phrase technology. Any transcriptional errors that result from this process are  unintentional.  Dara Lords MD, 11:19 AM 04/09/2013

## 2013-04-09 NOTE — H&P (Signed)
Kendra Griffin 1975/10/06 [id number]   History and Physical  Chief complaint: Menorrhagia, prolonged bleeding, dyspareunia, leiomyoma  History of present illness: 37 y.o. [id number] with history of worsening menses bleeding up to 3 weeks at a time, heavy, significant dysmenorrhea and consistent deep dyspareunia. Has Mirena IUD but bleeding is worsening despite this. Ultrasound showed an enlarged uterus 12 weeks size with multiple leiomyoma up to 6 cm. Options for management to include hormonal manipulation, uterine artery embolization, myomectomy and hysterectomy were reviewed. Prior trial of oral contraceptives which failed and subsequent Mirena IUD which now is failing. History of 2 prior cesarean sections and abdominal myomectomy.  Received Depo-Lupron end of June 2014 to attempt shrinkage of her leiomyoma and is now at the 10-12 week window and is admitted for attempted laparoscopic assisted hysterectomy, possible TAH.   Past medical history,surgical history, medications, allergies, family history and social history were all reviewed and documented in the EPIC chart. ROS:  Was performed and pertinent positives and negatives are included in the history of present illness.  Exam:  Kim assistant General: well developed, well nourished female, no acute distress HEENT: normal  Lungs: clear to auscultation without wheezing, rales or rhonchi  Cardiac: regular rate without rubs, murmurs or gallops  Abdomen: soft, nontender without masses, guarding, rebound, organomegaly  Pelvic: external bus vagina: normal   Cervix: grossly normal  Uterus: 8 weeks size irregular consistent with pedunculated/sub-serosal leiomyoma. Mobile, nontender.  Adnexa: without masses or tenderness      Assessment/Plan:  37 y.o. [id number] with leiomyoma, prolonged heavy menses, dysmenorrhea and consistent deep dyspareunia. Options for management reviewed and the patient wants to proceed with hysterectomy. Has been on  Depo-Lupron for 10+ weeks and her uterus has shrunk in response. We'll tentatively plan laparoscopic approach but discussed with patient with past history of myomectomy, C-section x2 and hernia repairs increased possibilities I will convert to an abdominal approach with larger incision and longer recovery.  Patient clearly understands and accepts this.  The expected intraoperative and postoperative courses as well as the recovery period was discussed with her. The ovarian conservation issue was discussed and the patient does want to keep both ovaries for continued hormone production. The benefits as far as symptom relief, cardiovascular and bone health reviewed versus the long-term risks of ovarian cancer and benign ovarian disease causing pain or other problems requiring reoperation discussed.  The patient does give me permission to remove one or both ovaries at my intraoperative decision if significant disease encountered.  The absolute irreversible sterility of hysterectomy was reviewed. Sexuality following hysterectomy was discussed to include the potential for persistent dyspareunia and orgasmic dysfunction following the procedure. The risks of infection, prolonged antibiotics, abscess/hematoma formation requiring reoperation and drainage were reviewed. Risk of hemorrhage necessitating transfusion the risks of transfusion discussed to include transfusion reaction, hepatitis, HIV, mad cow disease and other unknown entities. Incisional complications requiring opening and draining of incisions, closure by secondary intention, long-term issues of keloid/cosmetics and hernia formation all discussed. The potential for internal organ damage including bowel, bladder, ureters, nerves were reviewed. Increased risk with her history of prior surgeries discussed. Potential for more extensive reparative surgery in future. Surgeries to include ostomy formation, bowel resection, bladder repair, ureteral damage repair was all  discussed understood and accepted. The patient's questions are answered to her satisfaction she is ready to proceed with surgery.   Note: This document was prepared with digital dictation and possible smart phrase technology. Any transcriptional errors that result from this process  are unintentional.  Dara Lords MD, 12:34 PM 04/09/2013

## 2013-04-09 NOTE — Patient Instructions (Addendum)
Your procedure is scheduled on:04/16/13  Enter through the Main Entrance at :6am Pick up desk phone and dial [phone] and inform us of your arrival.  Please call [phone] if you have any problems the morning of surgery.  Remember: Do not eat food or drink liquids, including water, after midnight:Sunday  You may brush your teeth the morning of surgery.  Take these meds the morning of surgery with a sip of water: Synthroid  DO NOT wear jewelry, eye make-up, lipstick,body lotion, or dark fingernail polish.  (Polished toes are ok) You may wear deodorant.  If you are to be admitted after surgery, leave suitcase in car until your room has been assigned. Patients discharged on the day of surgery will not be allowed to drive home. Wear loose fitting, comfortable clothes for your ride home.

## 2013-04-09 NOTE — Patient Instructions (Signed)
Followup for surgery as scheduled. 

## 2013-04-15 MED ORDER — DEXTROSE 5 % IV SOLN
2.0000 g | INTRAVENOUS | Status: AC
  Administered 2013-04-16: 2 g via INTRAVENOUS
  Filled 2013-04-15: qty 2

## 2013-04-16 ENCOUNTER — Encounter (HOSPITAL_COMMUNITY)
Admission: RE | Disposition: A | Discharge status: Home / Self Care | Payer: Self-pay | Source: Ambulatory Visit | Attending: Gynecology

## 2013-04-16 ENCOUNTER — Encounter (HOSPITAL_COMMUNITY): Payer: Self-pay | Admitting: Anesthesiology

## 2013-04-16 ENCOUNTER — Observation Stay (HOSPITAL_COMMUNITY)
Admission: RE | Admit: 2013-04-16 | Discharge: 2013-04-18 | Disposition: A | Discharge status: Home / Self Care | Payer: BC Managed Care – PPO | Source: Ambulatory Visit | Attending: Gynecology | Admitting: Gynecology

## 2013-04-16 ENCOUNTER — Ambulatory Visit (HOSPITAL_COMMUNITY): Payer: BC Managed Care – PPO | Admitting: Anesthesiology

## 2013-04-16 DIAGNOSIS — D252 Subserosal leiomyoma of uterus: Secondary | ICD-10-CM | POA: Insufficient documentation

## 2013-04-16 DIAGNOSIS — D500 Iron deficiency anemia secondary to blood loss (chronic): Secondary | ICD-10-CM | POA: Insufficient documentation

## 2013-04-16 DIAGNOSIS — N800 Endometriosis of uterus: Secondary | ICD-10-CM

## 2013-04-16 DIAGNOSIS — N920 Excessive and frequent menstruation with regular cycle: Principal | ICD-10-CM | POA: Insufficient documentation

## 2013-04-16 DIAGNOSIS — N803 Endometriosis of pelvic peritoneum, unspecified: Secondary | ICD-10-CM | POA: Insufficient documentation

## 2013-04-16 DIAGNOSIS — IMO0002 Reserved for concepts with insufficient information to code with codable children: Secondary | ICD-10-CM | POA: Insufficient documentation

## 2013-04-16 DIAGNOSIS — D259 Leiomyoma of uterus, unspecified: Secondary | ICD-10-CM

## 2013-04-16 DIAGNOSIS — Z5331 Laparoscopic surgical procedure converted to open procedure: Secondary | ICD-10-CM | POA: Insufficient documentation

## 2013-04-16 DIAGNOSIS — D251 Intramural leiomyoma of uterus: Secondary | ICD-10-CM | POA: Insufficient documentation

## 2013-04-16 HISTORY — PX: ABDOMINAL HYSTERECTOMY: SHX81

## 2013-04-16 HISTORY — PX: LAPAROSCOPY: SHX197

## 2013-04-16 LAB — PREGNANCY, URINE: Preg Test, Ur: NEGATIVE

## 2013-04-16 SURGERY — LAPAROSCOPY, DIAGNOSTIC
Anesthesia: General | Site: Abdomen | Wound class: Clean Contaminated

## 2013-04-16 MED ORDER — MIDAZOLAM HCL 2 MG/2ML IJ SOLN
INTRAMUSCULAR | Status: DC | PRN
  Administered 2013-04-16: 2 mg via INTRAVENOUS

## 2013-04-16 MED ORDER — LIDOCAINE HCL (CARDIAC) 20 MG/ML IV SOLN
INTRAVENOUS | Status: DC | PRN
  Administered 2013-04-16: 80 mg via INTRAVENOUS

## 2013-04-16 MED ORDER — FENTANYL CITRATE 0.05 MG/ML IJ SOLN
INTRAMUSCULAR | Status: AC
  Filled 2013-04-16: qty 5

## 2013-04-16 MED ORDER — HYDROMORPHONE HCL PF 1 MG/ML IJ SOLN
0.2500 mg | INTRAMUSCULAR | Status: DC | PRN
  Administered 2013-04-16 (×2): 0.5 mg via INTRAVENOUS

## 2013-04-16 MED ORDER — ONDANSETRON HCL 4 MG/2ML IJ SOLN: 4.0000 mg | Freq: Four times a day (QID) | INTRAMUSCULAR | Status: DC | PRN

## 2013-04-16 MED ORDER — NEOSTIGMINE METHYLSULFATE 1 MG/ML IJ SOLN
INTRAMUSCULAR | Status: AC
  Filled 2013-04-16: qty 1

## 2013-04-16 MED ORDER — DIPHENHYDRAMINE HCL 12.5 MG/5ML PO ELIX: 12.5000 mg | ORAL_SOLUTION | Freq: Four times a day (QID) | ORAL | Status: DC | PRN

## 2013-04-16 MED ORDER — SODIUM CHLORIDE 0.9 % IJ SOLN: 9.0000 mL | INTRAMUSCULAR | Status: DC | PRN

## 2013-04-16 MED ORDER — NALOXONE HCL 0.4 MG/ML IJ SOLN: 0.4000 mg | INTRAMUSCULAR | Status: DC | PRN

## 2013-04-16 MED ORDER — FENTANYL CITRATE 0.05 MG/ML IJ SOLN
INTRAMUSCULAR | Status: DC | PRN
  Administered 2013-04-16 (×5): 50 ug via INTRAVENOUS
  Administered 2013-04-16: 100 ug via INTRAVENOUS

## 2013-04-16 MED ORDER — SODIUM CHLORIDE 0.9 % IJ SOLN
Freq: Once | INTRAMUSCULAR | Status: DC
  Filled 2013-04-16 (×2): qty 20

## 2013-04-16 MED ORDER — MORPHINE SULFATE (PF) 1 MG/ML IV SOLN
INTRAVENOUS | Status: DC
  Administered 2013-04-16: 1 mg via INTRAVENOUS
  Administered 2013-04-16: 12:00:00 via INTRAVENOUS
  Administered 2013-04-16: 3 mg via INTRAVENOUS
  Filled 2013-04-16: qty 25

## 2013-04-16 MED ORDER — KETOROLAC TROMETHAMINE 30 MG/ML IJ SOLN: 30.0000 mg | Freq: Four times a day (QID) | INTRAMUSCULAR | Status: DC

## 2013-04-16 MED ORDER — KETOROLAC TROMETHAMINE 30 MG/ML IJ SOLN
INTRAMUSCULAR | Status: AC
  Filled 2013-04-16: qty 1

## 2013-04-16 MED ORDER — LEVOTHYROXINE SODIUM 100 MCG PO TABS
100.0000 ug | ORAL_TABLET | Freq: Every day | ORAL | Status: DC
  Administered 2013-04-17 – 2013-04-18 (×2): 100 ug via ORAL
  Filled 2013-04-16 (×3): qty 1

## 2013-04-16 MED ORDER — DEXAMETHASONE SODIUM PHOSPHATE 10 MG/ML IJ SOLN
INTRAMUSCULAR | Status: DC | PRN
  Administered 2013-04-16: 10 mg via INTRAVENOUS

## 2013-04-16 MED ORDER — MIDAZOLAM HCL 2 MG/2ML IJ SOLN
INTRAMUSCULAR | Status: AC
  Filled 2013-04-16: qty 2

## 2013-04-16 MED ORDER — INFLUENZA VAC SPLIT QUAD 0.5 ML IM SUSP
0.5000 mL | INTRAMUSCULAR | Status: AC
  Administered 2013-04-17: 0.5 mL via INTRAMUSCULAR
  Filled 2013-04-16: qty 0.5

## 2013-04-16 MED ORDER — PROPOFOL 10 MG/ML IV BOLUS
INTRAVENOUS | Status: DC | PRN
  Administered 2013-04-16: 150 mg via INTRAVENOUS

## 2013-04-16 MED ORDER — DIPHENHYDRAMINE HCL 50 MG/ML IJ SOLN: 12.5000 mg | Freq: Four times a day (QID) | INTRAMUSCULAR | Status: DC | PRN

## 2013-04-16 MED ORDER — HYDROMORPHONE HCL PF 1 MG/ML IJ SOLN
INTRAMUSCULAR | Status: AC
  Administered 2013-04-16: 0.5 mg via INTRAVENOUS
  Filled 2013-04-16: qty 1

## 2013-04-16 MED ORDER — ROCURONIUM BROMIDE 100 MG/10ML IV SOLN
INTRAVENOUS | Status: DC | PRN
  Administered 2013-04-16 (×3): 10 mg via INTRAVENOUS
  Administered 2013-04-16: 40 mg via INTRAVENOUS

## 2013-04-16 MED ORDER — DIPHENHYDRAMINE HCL 25 MG PO CAPS: 50.0000 mg | ORAL_CAPSULE | Freq: Four times a day (QID) | ORAL | Status: DC | PRN

## 2013-04-16 MED ORDER — KETOROLAC TROMETHAMINE 30 MG/ML IJ SOLN
30.0000 mg | Freq: Four times a day (QID) | INTRAMUSCULAR | Status: DC
  Administered 2013-04-16 – 2013-04-17 (×3): 30 mg via INTRAVENOUS
  Filled 2013-04-16 (×3): qty 1

## 2013-04-16 MED ORDER — LACTATED RINGERS IV SOLN
INTRAVENOUS | Status: DC
  Administered 2013-04-16 (×4): via INTRAVENOUS

## 2013-04-16 MED ORDER — NEOSTIGMINE METHYLSULFATE 1 MG/ML IJ SOLN
INTRAMUSCULAR | Status: DC | PRN
  Administered 2013-04-16: 3 mg via INTRAVENOUS

## 2013-04-16 MED ORDER — DEXTROSE-NACL 5-0.9 % IV SOLN
INTRAVENOUS | Status: DC
  Administered 2013-04-16 – 2013-04-17 (×3): via INTRAVENOUS

## 2013-04-16 MED ORDER — GLYCOPYRROLATE 0.2 MG/ML IJ SOLN
INTRAMUSCULAR | Status: AC
  Filled 2013-04-16: qty 3

## 2013-04-16 MED ORDER — GLYCOPYRROLATE 0.2 MG/ML IJ SOLN
INTRAMUSCULAR | Status: DC | PRN
  Administered 2013-04-16: 0.6 mg via INTRAVENOUS

## 2013-04-16 MED ORDER — KETOROLAC TROMETHAMINE 30 MG/ML IJ SOLN
INTRAMUSCULAR | Status: DC | PRN
  Administered 2013-04-16: 30 mg via INTRAMUSCULAR

## 2013-04-16 MED ORDER — BUPIVACAINE HCL (PF) 0.25 % IJ SOLN
INTRAMUSCULAR | Status: AC
  Filled 2013-04-16: qty 30

## 2013-04-16 MED ORDER — ONDANSETRON HCL 4 MG/2ML IJ SOLN
INTRAMUSCULAR | Status: DC | PRN
  Administered 2013-04-16: 4 mg via INTRAVENOUS

## 2013-04-16 SURGICAL SUPPLY — 50 items
APL SKNCLS STERI-STRIP NONHPOA (GAUZE/BANDAGES/DRESSINGS) ×2
BARRIER ADHS 3X4 INTERCEED (GAUZE/BANDAGES/DRESSINGS) IMPLANT
BENZOIN TINCTURE PRP APPL 2/3 (GAUZE/BANDAGES/DRESSINGS) ×2 IMPLANT
BRR ADH 4X3 ABS CNTRL BYND (GAUZE/BANDAGES/DRESSINGS)
CANISTER SUCTION 2500CC (MISCELLANEOUS) ×5 IMPLANT
CLOTH BEACON ORANGE TIMEOUT ST (SAFETY) ×3 IMPLANT
CONT PATH 16OZ SNAP LID 3702 (MISCELLANEOUS) ×3 IMPLANT
CONT SPECI 4OZ STER CLIK (MISCELLANEOUS) ×3 IMPLANT
COVER MAYO STAND STRL (DRAPES) ×3 IMPLANT
COVER TABLE BACK 60X90 (DRAPES) ×3 IMPLANT
DECANTER SPIKE VIAL GLASS SM (MISCELLANEOUS) ×2 IMPLANT
DRESSING TELFA 8X3 (GAUZE/BANDAGES/DRESSINGS) IMPLANT
DRSG OPSITE POSTOP 4X10 (GAUZE/BANDAGES/DRESSINGS) ×2 IMPLANT
ELECT REM PT RETURN 9FT ADLT (ELECTROSURGICAL) ×3
ELECTRODE REM PT RTRN 9FT ADLT (ELECTROSURGICAL) ×1 IMPLANT
GLOVE BIO SURGEON STRL SZ7.5 (GLOVE) ×6 IMPLANT
GLOVE BIOGEL PI IND STRL 8 (GLOVE) ×2 IMPLANT
GLOVE BIOGEL PI INDICATOR 8 (GLOVE) ×1
GLOVE ECLIPSE 7.5 STRL STRAW (GLOVE) ×3 IMPLANT
GOWN STRL REIN XL XLG (GOWN DISPOSABLE) ×12 IMPLANT
NDL HYPO 25X1 1.5 SAFETY (NEEDLE) ×1 IMPLANT
NDL SPNL 18GX3.5 QUINCKE PK (NEEDLE) ×1 IMPLANT
NEEDLE HYPO 25X1 1.5 SAFETY (NEEDLE) ×3 IMPLANT
NEEDLE SPNL 18GX3.5 QUINCKE PK (NEEDLE) ×3 IMPLANT
NS IRRIG 1000ML POUR BTL (IV SOLUTION) ×3 IMPLANT
PACK LAVH (CUSTOM PROCEDURE TRAY) ×3 IMPLANT
PAD OB MATERNITY 4.3X12.25 (PERSONAL CARE ITEMS) ×3 IMPLANT
PROTECTOR NERVE ULNAR (MISCELLANEOUS) ×5 IMPLANT
SET IRRIG TUBING LAPAROSCOPIC (IRRIGATION / IRRIGATOR) ×2 IMPLANT
SPONGE LAP 18X18 X RAY DECT (DISPOSABLE) ×6 IMPLANT
STAPLER VISISTAT 35W (STAPLE) ×3 IMPLANT
SUT PLAIN 4 0 FS 2 27 (SUTURE) ×3 IMPLANT
SUT VIC AB 0 CT1 18XCR BRD8 (SUTURE) ×6 IMPLANT
SUT VIC AB 0 CT1 27 (SUTURE) ×9
SUT VIC AB 0 CT1 27XBRD ANBCTR (SUTURE) ×6 IMPLANT
SUT VIC AB 0 CT1 8-18 (SUTURE) ×9
SUT VIC AB 2-0 CT1 27 (SUTURE) ×6
SUT VIC AB 2-0 CT1 TAPERPNT 27 (SUTURE) ×2 IMPLANT
SUT VIC AB 2-0 SH 27 (SUTURE) ×3
SUT VIC AB 2-0 SH 27XBRD (SUTURE) ×2 IMPLANT
SUT VIC AB 4-0 KS 27 (SUTURE) ×2 IMPLANT
SUT VICRYL 0 TIES 12 18 (SUTURE) ×3 IMPLANT
SUT VICRYL 0 UR6 27IN ABS (SUTURE) ×3 IMPLANT
SYR BULB IRRIGATION 50ML (SYRINGE) ×3 IMPLANT
SYR CONTROL 10ML LL (SYRINGE) ×3 IMPLANT
TOWEL OR 17X24 6PK STRL BLUE (TOWEL DISPOSABLE) ×6 IMPLANT
TRAY FOLEY CATH 14FR (SET/KITS/TRAYS/PACK) ×3 IMPLANT
TROCAR XCEL NON-BLD 11X100MML (ENDOMECHANICALS) ×3 IMPLANT
TROCAR XCEL NON-BLD 5MMX100MML (ENDOMECHANICALS) ×4 IMPLANT
WATER STERILE IRR 1000ML POUR (IV SOLUTION) ×3 IMPLANT

## 2013-04-16 NOTE — Transfer of Care (Signed)
Immediate Anesthesia Transfer of Care Note  Patient: Kendra Griffin  Procedure(s) Performed: Procedure(s): LAPAROSCOPY DIAGNOSTIC (N/A) HYSTERECTOMY ABDOMINAL (N/A)  Patient Location: PACU  Anesthesia Type:General  Level of Consciousness: awake, alert , oriented and patient cooperative  Airway & Oxygen Therapy: Patient Spontanous Breathing and Patient connected to nasal cannula oxygen  Post-op Assessment: Report given to PACU RN and Post -op Vital signs reviewed and stable  Post vital signs: Reviewed and stable  Complications: No apparent anesthesia complications

## 2013-04-16 NOTE — Progress Notes (Signed)
Patient ID: Kendra Griffin, female   DOB: 1975-11-26, 37 y.o.   MRN: [id number]  Awake, alert with mild to moderate pain relieved with PCA Taking sips of fluids  Afeb, VSS Abd soft, minimal tenderness, dressing dry/intact  150cc clear yellow urine in foley  Assessment and plan:  Results of surgery reviewed to include indications for TAH.  Continue routine postoperative care.

## 2013-04-16 NOTE — Preoperative (Signed)
Beta Blockers   Reason not to administer Beta Blockers:Not Applicable 

## 2013-04-16 NOTE — Anesthesia Postprocedure Evaluation (Signed)
Anesthesia Post Note  Patient: Kendra Griffin  Procedure(s) Performed: Procedure(s) (LRB): LAPAROSCOPY DIAGNOSTIC (N/A) HYSTERECTOMY ABDOMINAL (N/A)  Anesthesia type: General  Patient location: PACU  Post pain: Pain level controlled  Post assessment: Post-op Vital signs reviewed  Last Vitals:  Filed Vitals:   04/16/13 0945  BP: 136/73  Pulse: 66  Temp: 36.6 C  Resp: 17    Post vital signs: Reviewed  Level of consciousness: sedated  Complications: No apparent anesthesia complications

## 2013-04-16 NOTE — Anesthesia Preprocedure Evaluation (Addendum)
Anesthesia Evaluation  Patient identified by MRN, date of birth, ID band Patient awake    Reviewed: Allergy & Precautions, H&P , Patient's Chart, lab work & pertinent test results, reviewed documented beta blocker date and time   Airway Mallampati: II TM Distance: >3 FB Neck ROM: full    Dental no notable dental hx. (+) Caps,    Pulmonary  breath sounds clear to auscultation  Pulmonary exam normal       Cardiovascular Rhythm:regular Rate:Normal     Neuro/Psych    GI/Hepatic   Endo/Other  Hypothyroidism   Renal/GU      Musculoskeletal   Abdominal   Peds  Hematology   Anesthesia Other Findings   Reproductive/Obstetrics                          Anesthesia Physical Anesthesia Plan  ASA: II  Anesthesia Plan: General   Post-op Pain Management:    Induction: Intravenous  Airway Management Planned: Oral ETT  Additional Equipment:   Intra-op Plan:   Post-operative Plan: Extubation in OR  Informed Consent: I have reviewed the patients History and Physical, chart, labs and discussed the procedure including the risks, benefits and alternatives for the proposed anesthesia with the patient or authorized representative who has indicated his/her understanding and acceptance.   Dental Advisory Given and Dental advisory given  Plan Discussed with: CRNA and Surgeon  Anesthesia Plan Comments: (  Discussed general anesthesia, including possible nausea, instrumentation of airway, sore throat,pulmonary aspiration, etc. I asked if the were any outstanding questions, or  concerns before we proceeded. )        Anesthesia Quick Evaluation

## 2013-04-16 NOTE — H&P (Signed)
  The patient was examined.  I reviewed the proposed surgery and consent form with the patient.  The dictated history and physical is current and accurate and all questions were answered. The patient is ready to proceed with surgery and has a realistic understanding and expectation for the outcome.   Dara Lords MD, 7:11 AM 04/16/2013

## 2013-04-16 NOTE — Op Note (Signed)
Kendra Griffin 1976/07/26 [id number]   Post Operative Note   Date of surgery:  04/16/2013  Pre Op Dx:  Menorrhagia, dyspareunia, leiomyoma  Post Op Dx:  Menorrhagia, dyspareunia, leiomyoma, endometriosis, abdominal/pelvic adhesions  Procedure:  Diagnostic laparoscopy. Total abdominal hysterectomy. Lysis of adhesions.  Surgeon:  Dara Lords  Assistant: Reynaldo Minium  Anesthesia:  General  EBL:  300 cc  Urinary output: 300 cc  Complications:  None  Specimen:  Uterus with IUD to pathology. Clinical weight 395 g.  Findings: EUA:  External BUS vagina normal. Cervix grossly normal. Uterus bulky 10 weeks irregular midline mobile. Adnexa without gross masses   Operative:  Anterior cul-de-sac with thick adhesive band involving entire anterior/lower uterine segment uterus to anterior abdominal wall. Posterior cul-de-sac obliterated with adhesions. Uterus enlarged with multiple subserosal/intramural myomas. Classic powder burn endometriotic implant right fundal uterine serosa. Left ovary and fallopian tube segment grossly normal adherent to the left uterine serosal sidewall. Right ovary and fallopian tube segment grossly normal adherent to the left uterine serosal sidewall. Upper abdominal exam: Appendix not visualized. Mid upper epigastric intestinal to anterior abdominal wall adhesions. Liver smooth without abnormalities. Gallbladder not visualized.  Procedure:  The patient was taken to the operating room, underwent general anesthesia, was placed in the low dorsal lithotomy position, received an abdominal/perineal/vaginal preparation with Betadine solution and an EUA was performed. The time out was performed by the surgical team. A Hulka tenaculum was placed through the cervix and a Foley catheter was placed in sterile technique. The patient was draped in the usual fashion and after an NG tube was placed a small incision was made in the left upper quadrant several fingerbreadths below  the mid costal line and a 5 mm Optiview direct entry trocar was placed under direct visualization without difficulty. The abdomen was then insufflated and examination of the pelvis and upper abdominal exam was carried out with findings noted above. Due to the bulk of the uterus, adhesions of the ovaries to the uterine serosal sidewall, obliteration of the posterior cul-de-sac with adhesions and the thick anterior vesicouterine fold-anterior abdominal wall adhesive band it was decided the most prudent approach would be through a Pfannenstiel incision as was previously discussed with the patient. The 5 mm port was removed and the skin incision closed with Dermabond skin adhesive. The abdomen was then directly entered through a repeat Pfannenstiel incision achieving adequate hemostasis at all levels. Due to the bulk of the uterus and other pathology additional exposure was required and splitting of the rectus muscles medially approximately 1/3 of each with was accomplished using electrocautery. A Balfour retractor and bladder blade was then placed within the incision and the intestines were packed from the operative field. The Hulka tenaculum was removed from the vagina. The uterus was elevated from the pelvis and through blunt and sharp dissection the posterior cul-de-sac adhesions were then lysed. The right uterine ovarian pedicle was then clamped cut and doubly ligated using 0 Vicryl suture and a similar procedure was carried out on the other side. The right and left round ligaments were identified and ligated using 0 Vicryl suture and transected with electrocautery. The vesicouterine fold to include the thick adhesive band was sharply and bluntly developed and the bladder was sharply taken down from the lower uterine segment without difficulty. The right parametrial tissues were incised to expose the uterine vessels at the level of the lower uterine segment and these were clamped cut and ligated using 0 Vicryl  suture. A similar procedure  was carried out on the other side. The lower uterine segment/paracervical tissues were freed through clamping cutting and ligating of the parametrial/paracervical tissues using 0 Vicryl suture to below the junction with the vagina and the upper vagina was then crossclamped and the specimen excised.  Examination showed complete excision of the cervix. The intervening vaginal mucosa was then closed anterior to posterior with a single interrupted figure-of-eight stitch. Right and left angle sutures were then placed using 0 Vicryl suture and tagged for future reference. The pelvis was then copiously irrigated with several small bleeding points addressed with electrocautery and due to freeing of the extensive vesicouterine adhesions prophylactic Surgicel was placed in this space to avoid future bleeding. Inspection of both pelvic sidewalls showed normal ureteral peristalsis with both ureters away from the operative fields. The Balfour retractor and bladder blade were removed from the incision and the bowel packing was removed. The fascia was reapproximated using 0 Vicryl suture in a running stitch starting at the angle meeting in the middle. The subcutaneous tissues were irrigated with hemostasis achieved with electrocautery and the skin was reapproximated using 4-0 Vicryl suture in a running subcuticular stitch. Steri-Strips with benzoin was applied afterwards as was a sterile dressing. The patient was awakened and then taken to the recovery room in good condition having tolerated the procedure well receiving intraoperative Toradol.  Free-flowing clear yellow urine was noted within the Foley catheter. The uterus was incised following the procedure and the IUD was found to be within the endometrial cavity.   Note: This document was prepared with digital dictation and possible smart phrase technology. Any transcriptional errors that result from this process are  unintentional.  Dara Lords MD, 10:55 AM 04/16/2013

## 2013-04-17 ENCOUNTER — Encounter (HOSPITAL_COMMUNITY): Payer: Self-pay | Admitting: Gynecology

## 2013-04-17 LAB — CBC
HCT: 25.6 % — ABNORMAL LOW (ref 36.0–46.0)
MCH: 30.8 pg (ref 26.0–34.0)
MCHC: 35.9 g/dL (ref 30.0–36.0)
MCV: 85.6 fL (ref 78.0–100.0)
Platelets: 130 10*3/uL — ABNORMAL LOW (ref 150–400)
RBC: 2.99 MIL/uL — ABNORMAL LOW (ref 3.87–5.11)
RDW: 12.7 % (ref 11.5–15.5)
WBC: 6.9 10*3/uL (ref 4.0–10.5)

## 2013-04-17 MED ORDER — IBUPROFEN 800 MG PO TABS
800.0000 mg | ORAL_TABLET | Freq: Three times a day (TID) | ORAL | Status: DC
  Administered 2013-04-17 – 2013-04-18 (×2): 800 mg via ORAL
  Filled 2013-04-17 (×2): qty 1

## 2013-04-17 MED ORDER — HYDROCODONE-ACETAMINOPHEN 5-325 MG PO TABS
2.0000 | ORAL_TABLET | Freq: Four times a day (QID) | ORAL | Status: DC | PRN
  Administered 2013-04-17: 1 via ORAL
  Administered 2013-04-17 – 2013-04-18 (×3): 2 via ORAL
  Filled 2013-04-17: qty 2
  Filled 2013-04-17: qty 1
  Filled 2013-04-17 (×2): qty 2

## 2013-04-17 MED ORDER — IBUPROFEN 800 MG PO TABS
800.0000 mg | ORAL_TABLET | Freq: Three times a day (TID) | ORAL | Status: DC
  Administered 2013-04-17: 800 mg via ORAL
  Filled 2013-04-17: qty 1

## 2013-04-17 MED ORDER — BISACODYL 10 MG RE SUPP: 10.0000 mg | Freq: Every day | RECTAL | Status: DC | PRN

## 2013-04-17 NOTE — Progress Notes (Signed)
Kendra Griffin 1975/08/03 [id number]   1 Day Post-Op s/p Procedure(s): LAPAROSCOPY DIAGNOSTIC HYSTERECTOMY ABDOMINAL  Subjective: Patient reports no complaints, no acute distress, pain severity reported mild, yes taking PO, foley catheter out, yes voiding, yes ambulating, no passing flatus  Objective: Vital signs in last 24 hours: Temp:  [97.7 F (36.5 C)-98.4 F (36.9 C)] 98.3 F (36.8 C) (09/23 1400) Pulse Rate:  [67-79] 67 (09/23 1400) Resp:  [16-22] 18 (09/23 1400) BP: (114-130)/(53-79) 114/54 mmHg (09/23 1400) SpO2:  [99 %-100 %] 100 % (09/23 1400) Weight:  [178 lb (80.74 kg)] 178 lb (80.74 kg) (09/22 2035) Last BM Date: 04/16/13  EXAM General: awake, no distress Resp: rhonchi clear to auscultation bilaterally Cardio: regular rate and rhythm, S1, S2 normal, no murmur, click, rub or gallop GI: soft, minimal tender, bowel sounds active, dressing Lower Extremities: Without swelling or tenderness Vaginal Bleeding: reported scant  Lab Results:   Recent Labs  04/17/13 0515  WBC 6.9  HGB 9.2*  HCT 25.6*  PLT 130*    Assessment: s/p Procedure(s): LAPAROSCOPY DIAGNOSTIC HYSTERECTOMY ABDOMINAL: progressing well  Plan: Continue routine post operative care, ambulating, eating, voiding with good pain relief on PO pain meds.  Awaiting flatus.  Offered discharge tonight but patient prefers to wait until the AM.  Continue routine postoperative care.  LOS: 1 day   Note: This document was prepared with digital dictation and possible smart phrase technology. Any transcriptional errors that result from this process are unintentional.  Dara Lords, MD 04/17/2013 4:49 PM

## 2013-04-17 NOTE — Progress Notes (Signed)
Kendra Griffin 1975-07-27 [id number]   1 Day Post-Op s/p Procedure(s): LAPAROSCOPY DIAGNOSTIC HYSTERECTOMY ABDOMINAL  Subjective: Patient reports has no problems, no acute distress, pain severity reported mild, yes taking PO, foley catheter in place, yes ambulating, no passing flatus  Objective: Vital signs in last 24 hours: Temp:  [97.4 F (36.3 C)-98.4 F (36.9 C)] 98.3 F (36.8 C) (09/23 0531) Pulse Rate:  [57-79] 69 (09/23 0531) Resp:  [14-22] 18 (09/23 0531) BP: (117-137)/(53-85) 121/77 mmHg (09/23 0531) SpO2:  [99 %-100 %] 99 % (09/23 0531) Weight:  [178 lb (80.74 kg)] 178 lb (80.74 kg) (09/22 2035) Last BM Date: 04/16/13  EXAM General: awake, alert and no distress Resp: rhonchi clear to auscultation bilaterally Cardio: regular rate and rhythm, S1, S2 normal, no murmur, click, rub or gallop GI: soft, minimal tenderness, bowel sounds present, dressing dry Lower Extremities: Without swelling or tenderness Vaginal Bleeding: scant  Lab Results:   Recent Labs  04/17/13 0515  WBC 6.9  HGB 9.2*  HCT 25.6*  PLT 130*    Assessment: s/p Procedure(s): LAPAROSCOPY DIAGNOSTIC HYSTERECTOMY ABDOMINAL: Progressing well  Plan: Continue routine post operative care  Advance diet Encourage ambulation Advance to PO medication Discontinue IV fluids Foley out Possible discharge later today   LOS: 1 day   Note: This document was prepared with digital dictation and possible smart phrase technology. Any transcriptional errors that result from this process are unintentional.  Dara Lords, MD 04/17/2013 8:38 AM

## 2013-04-18 ENCOUNTER — Other Ambulatory Visit: Payer: Self-pay | Admitting: Gynecology

## 2013-04-18 MED ORDER — IBUPROFEN 800 MG PO TABS: 800.0000 mg | ORAL_TABLET | Freq: Three times a day (TID) | ORAL | Status: DC | PRN

## 2013-04-18 MED ORDER — HYDROCODONE-ACETAMINOPHEN 5-325 MG PO TABS: 2.0000 | ORAL_TABLET | Freq: Four times a day (QID) | ORAL | Status: DC | PRN

## 2013-04-18 NOTE — Progress Notes (Signed)
Patient ID: Kendra Griffin, female   DOB: November 18, 1975, 37 y.o.   MRN: [id number] CEARRA PORTNOY 12/29/1975 [id number]   2 Days Post-Op Procedure(s) (LRB): LAPAROSCOPY DIAGNOSTIC (N/A) HYSTERECTOMY ABDOMINAL (N/A)  Subjective: Patient reports feels well, no acute distress, pain severity reported mild, yes taking PO, foley catheter out, yes voiding, yes ambulating, yes passing flatus  Objective: Vital signs in last 24 hours: Temp:  [97.9 F (36.6 C)-98.5 F (36.9 C)] 97.9 F (36.6 C) (09/24 0612) Pulse Rate:  [67-80] 76 (09/24 0612) Resp:  [16-18] 16 (09/24 0612) BP: (114-125)/(54-76) 118/75 mmHg (09/24 0612) SpO2:  [99 %-100 %] 100 % (09/24 0612) Last BM Date: 04/16/13    EXAM General: awake, no distress Resp: rhonchi clear to auscultation bilaterally Cardio: regular rate and rhythm, S1, S2 normal, no murmur, click, rub or gallop GI: soft, minimally tender, bowel sounds active, incisions dry intact X2 Lower Extremities: Without swelling or tenderness Vaginal Bleeding: Reported scant   Lab Results:   Recent Labs  04/17/13 0515  WBC 6.9  HGB 9.2*  HCT 25.6*  PLT 130*    Assessment: s/p Procedure(s): LAPAROSCOPY DIAGNOSTIC HYSTERECTOMY ABDOMINAL: progressing well, ready for discharge.    Plan: Discharge home today.  Precautions, instructions and follow up were discussed with the patient.  Prescriptions provided per AVS.  Patient to call the office to arrange a post-operative appointmant in 2 weeks.   Note: This document was prepared with digital dictation and possible smart phrase technology. Any transcriptional errors that result from this process are unintentional.  Dara Lords, MD 04/18/2013 7:23 AM

## 2013-04-18 NOTE — Discharge Summary (Signed)
  Kendra Griffin 29-Sep-1975 [id number]   Discharge Summary  Date of Admission:  04/16/2013  Date of Discharge:  04/18/2013   Discharge Diagnosis:  Menorrhagia, dyspareunia, leiomyoma, endometriosis, anemia due to blood loss  Procedure:  Procedure(s): LAPAROSCOPY DIAGNOSTIC HYSTERECTOMY ABDOMINAL  Pathology: Accession: [id number] Diagnosis Uterus and cervix - LEIOMYOMATA. - ENDOMETRIUM: BENIGN WEAKLY PROLIFERATIVE ENDOMETRIUM, NO ATYPIA, HYPERPLASIA OR MALIGNANCY. - CERVIX: BENIGN SQUAMOUS MUCOSA AND ENDOCERVICAL MUCOSA, NO DYSPLASIA OR MALIGNANCY.  Hospital Course:  The patient underwent diagnostic laparoscopy, total abdominal hysterectomy without complications 04/16/2013. Findings at the time of surgery included multiple leiomyoma with clinical uterine weight 395 g, serosal endometriosis, posterior cul-de-sac adhesions, anterior vesicouterine fold thick adhesions. The patient's postoperative course was uncomplicated and she was discharged on postoperative day #2 ambulating well, tolerating a regular diet, voiding and passing flatus with good pain relief on oral medication. Postoperative hemoglobin 9.2. The patient received instructions for postoperative care and call precautions.  She received prescriptions per AVS and will be seen in the office 2 weeks following discharge.     Note: This document was prepared with digital dictation and possible smart phrase technology. Any transcriptional errors that result from this process are unintentional.   Dara Lords MD, 9:23 AM 04/18/2013

## 2013-04-18 NOTE — Progress Notes (Signed)
Pt is discharged in the care of friend. Downstairs per  Wheelchair. Stable. State she is feeling good relief from pain med.Discharge instructions were given to pt wih good understanding. Questions were asked and answered.

## 2013-04-19 ENCOUNTER — Ambulatory Visit (INDEPENDENT_AMBULATORY_CARE_PROVIDER_SITE_OTHER): Payer: BC Managed Care – PPO | Admitting: Gynecology

## 2013-04-19 ENCOUNTER — Encounter: Payer: Self-pay | Admitting: Gynecology

## 2013-04-19 VITALS — Temp 98.8°F

## 2013-04-19 DIAGNOSIS — R21 Rash and other nonspecific skin eruption: Secondary | ICD-10-CM

## 2013-04-19 MED ORDER — BETAMETHASONE DIPROPIONATE AUG 0.05 % EX CREA: TOPICAL_CREAM | Freq: Two times a day (BID) | CUTANEOUS | Status: DC

## 2013-04-19 MED ORDER — AMOXICILLIN-POT CLAVULANATE 500-125 MG PO TABS: 1.0000 | ORAL_TABLET | Freq: Three times a day (TID) | ORAL | Status: DC

## 2013-04-19 NOTE — Progress Notes (Addendum)
Patient presents 3 days postop complaining of rash around her incision and tenderness. Of note she had a similar episode after her C-section and was told she had an allergic reaction to the tape. No fevers or chills. No drainage from her incision site.  Temperature 98.8. Abdomen soft, minimally tender without masses guarding rebound organomegaly. Left upper quadrant laparoscopic port intact with Dermabond Pfannenstiel incision with dramatic incisional erythema and induration around the entire incision. No fluctuance or drainage. Skin intact. Steri-Strips were removed.  Assessment and plan: Probable allergic reaction either to the Steri-Strips, surgical dressing or Benzoyne. Early for infection. Treat with Diprolene 0.05% cream 3 times a day for allergic reaction. Cover for early cellulitis with Augmentin 500 3 times a day x7 days. Followup if symptoms persist or worsen.

## 2013-04-19 NOTE — Patient Instructions (Signed)
Start on antibiotics 3 times daily. Apply the steroid cream to the incision site 3 times daily.

## 2013-04-20 ENCOUNTER — Telehealth: Payer: Self-pay | Admitting: Gynecology

## 2013-04-20 NOTE — Telephone Encounter (Signed)
I called patient in followup of her visit yesterday with the peri-incisional rash. I thought that this was an allergic reaction maybe to the Benzoyne or Steri-Strips, possibly the dressing and treated her with Diprolene 0.05% cream twice a day but I did cover her with Augmentin 500 3 times a day in the event it would be a cellulitis although it seems awful early. She also gave a history of this happening after her C-section and was told that she was allergic to the dressing.  The patient feels that she is getting better but the incision is not as red and not as tender. She noted that last time she did not present this early and the skin started to blister up at this time it does not seem to be doing that which again is the worst allergic reaction. Patient will continue with the Diprolene cream and the antibiotics. I've instructed her to wash over this area with mild soap to remove any allergen and call me if there are any questions or if she does not continue to improve. Otherwise she'll followup when she is scheduled for her next postoperative visit.

## 2013-04-23 ENCOUNTER — Other Ambulatory Visit: Payer: Self-pay | Admitting: Women's Health

## 2013-04-24 ENCOUNTER — Other Ambulatory Visit: Payer: Self-pay | Admitting: Women's Health

## 2013-05-02 ENCOUNTER — Encounter: Payer: Self-pay | Admitting: Gynecology

## 2013-05-02 ENCOUNTER — Ambulatory Visit (INDEPENDENT_AMBULATORY_CARE_PROVIDER_SITE_OTHER): Payer: BC Managed Care – PPO | Admitting: Gynecology

## 2013-05-02 DIAGNOSIS — Z9889 Other specified postprocedural states: Secondary | ICD-10-CM

## 2013-05-02 MED ORDER — FLUCONAZOLE 150 MG PO TABS: 150.0000 mg | ORAL_TABLET | Freq: Once | ORAL | Status: DC

## 2013-05-02 NOTE — Patient Instructions (Signed)
Follow-up in 2 weeks for your next postoperative visit. 

## 2013-05-02 NOTE — Progress Notes (Signed)
Postop checkup 2 weeks status post TAH. Was seen initially for probable allergic reaction with a peri-incisional erythematous rash. Is getting better. Complaining of vaginal discharge with itching following the initiation of her oral antibiotics.  Exam was Beren[facility] Bouton Abdomen soft with minimal incisional tenderness. Incision well approximated. Peri-incisional hyperpigmented rash persists but much improved from prior exam. Pelvic external BUS vagina normal with whitish discharge. Cuff intact with minimal tenderness. Bimanual without tenderness/masses.  Assessment and plan: Skin allergic reaction I suspect to Benzoyne given the uniform peri-incisional halo in the Benzoyne distribution. Recommended that patient alert all surgeons in the future she would need surgery to avoid Benzoyne Steri-Strips and adhesive tape. We'll continue with Diprolene nightly. Otherwise healing nicely and she'll slowly resume normal activities with the exception of pelvic rest. Diflucan 150 mg #2 pills one now repeat as needed for suspected yeast vaginitis.  Did not do wet prep today given classic history. Followup in 2 weeks for next postoperative checkup.

## 2013-05-15 ENCOUNTER — Ambulatory Visit (INDEPENDENT_AMBULATORY_CARE_PROVIDER_SITE_OTHER): Payer: BC Managed Care – PPO | Admitting: Gynecology

## 2013-05-15 ENCOUNTER — Encounter: Payer: Self-pay | Admitting: Gynecology

## 2013-05-15 DIAGNOSIS — Z9889 Other specified postprocedural states: Secondary | ICD-10-CM

## 2013-05-15 DIAGNOSIS — R1031 Right lower quadrant pain: Secondary | ICD-10-CM

## 2013-05-15 MED ORDER — OXYCODONE-ACETAMINOPHEN 2.5-325 MG PO TABS: 1.0000 | ORAL_TABLET | ORAL | Status: DC | PRN

## 2013-05-15 MED ORDER — BETAMETHASONE DIPROPIONATE AUG 0.05 % EX CREA: TOPICAL_CREAM | Freq: Two times a day (BID) | CUTANEOUS | Status: DC

## 2013-05-15 NOTE — Progress Notes (Signed)
Patient called having her 4 week postop appointment scheduled several days complaining of right incisional pain over the last several days and was told to come in. At the angle of her incision there is some aching to pulling pain that comes and goes. No drainage or other symptoms. The peri-incisional rash seems to be getting better slowly.  Exam was [employer] Abdomen is soft with minimal incisional tenderness. Area patient's pointing to is at the upper outer right aspect of her incision. No visual or palpable abnormalities to include no hernia or masses. No acute changes such as guarding rebound. Skin is well approximated without evidence of cellulitis or drainage. Hyperpigmentation in a halo formation around the incision consistent with the Benzoyne distribution. Pelvic external BUS vagina with cuff intact, bimanual without masses or tenderness.  Assessment and plan: Tenderness right angle of her incision which I think is healing and where she has pulled it being more active. Recommended heat, rest and I refilled her prescription with oxycodone  2.5/325 #30 no refill. She is scheduled to return to work in 2 weeks but feels she may not be able to do this as she has to lift 50 pounds. She will check about light duty and if they can't accommodate that then we'll keep her out of 8 weeks. Reviewed her incision has healed nicely. The rash seems to slowly be improving. She notes that after her C-section she did the exact same thing and it took months for the hyperpigmentation to resolve. I again reviewed with her I do not think it was tape or Steri-Strips but the Benzoyne and in the future she should definitely avoid contact with this. I did refill her Diprolene 0.05% cream to use and she'll continue to monitor.  Patient will cancel her appointment scheduled in several days and followup in 2 weeks for a final postop check. She will slowly resume all normal activities with the exception of continued pelvic  rest.

## 2013-05-15 NOTE — Patient Instructions (Signed)
Continue to use the steroid cream around the skin rash as needed. Followup in 2 weeks for your next postoperative check. Slowly resume all normal activities with the exception of nothing in the vagina until your next postop checkup.

## 2013-05-17 ENCOUNTER — Ambulatory Visit: Payer: BC Managed Care – PPO | Admitting: Gynecology

## 2013-05-22 ENCOUNTER — Telehealth: Payer: Self-pay

## 2013-05-22 NOTE — Telephone Encounter (Signed)
Spoke with Rayfield Citizen at [employer] and provided this info to her.

## 2013-05-22 NOTE — Telephone Encounter (Signed)
#  1 no lifting heavier than 10 pounds. No standing longer than 20 minutes. Sitting okay. #2 through 8 weeks postop #3 through 8 weeks postop

## 2013-05-22 NOTE — Telephone Encounter (Signed)
Disability Co.called with a few questions. Your last note read:  "Assessment and plan: Tenderness right angle of her incision which I think is healing and where she has pulled it being more active. Recommended heat, rest and I refilled her prescription with oxycodone 2.5/325 #30 no refill. She is scheduled to return to work in 2 weeks but feels she may not be able to do this as she has to lift 50 pounds. She will check about light duty and if they can't accommodate that then we'll keep her out of 8 weeks.  Reviewed her incision has healed nicely. The rash seems to slowly be improving. She notes that after her C-section she did the exact same thing and it took months for the hyperpigmentation to resolve. I again reviewed with her I do not think it was tape or Steri-Strips but the Benzoyne and in the future she should definitely avoid contact with this. I did refill her Diprolene 0.05% cream to use and she'll continue to monitor. Patient will cancel her appointment scheduled in several days and followup in 2 weeks for a final postop check. She will slowly resume all normal activities with the exception of continued pelvic rest."  1. The disability company is wanting you to define "light duty" as to whether it includes lifting-how much?, standing, sitting, etc.  2. They would also like to know how long you think she will need to be "light duty".  3. When you think she will be able to return without restrictions?

## 2013-05-30 ENCOUNTER — Telehealth: Payer: Self-pay

## 2013-05-30 ENCOUNTER — Ambulatory Visit (INDEPENDENT_AMBULATORY_CARE_PROVIDER_SITE_OTHER): Payer: BC Managed Care – PPO | Admitting: Gynecology

## 2013-05-30 ENCOUNTER — Ambulatory Visit: Payer: BC Managed Care – PPO | Admitting: Gynecology

## 2013-05-30 ENCOUNTER — Encounter: Payer: Self-pay | Admitting: Gynecology

## 2013-05-30 DIAGNOSIS — Z9889 Other specified postprocedural states: Secondary | ICD-10-CM

## 2013-05-30 NOTE — Patient Instructions (Signed)
Followup in April 2015 for annual exam. Sooner if any issues.

## 2013-05-30 NOTE — Progress Notes (Signed)
Patient presents for her final postoperative visit status post TAH. She's done well without complaints.  Exam was Kendra Griffin Abdomen soft nontender without masses guarding rebound organomegaly. Incisions well-healed. Peri-incisional hyperpigmentation is resolving. Pelvic external BUS vagina with cuff healed nicely with no tenderness or masses on bimanual exam.  Assessment and plan: Normal postoperative visit status post TAH. Had skin reaction to Benzoyne which now is resolving with minimal hyperpigmentation. Patient will slowly resume all normal activities. Followup in April 2015 when she is due for her annual exam, sooner as needed.

## 2013-05-30 NOTE — Telephone Encounter (Signed)
Forms completed and mailed to patient at her request.

## 2013-05-30 NOTE — Telephone Encounter (Signed)
OK 

## 2013-05-30 NOTE — Telephone Encounter (Signed)
Patient dropped by her disability and return to work forms.  She plans to return to work on 06/12/13, which will be 8 weeks post op, with no restrictions.  Before completing forms I wanted to confirm that you were fine with this.

## 2013-06-27 ENCOUNTER — Telehealth: Payer: Self-pay | Admitting: *Deleted

## 2013-06-27 NOTE — Telephone Encounter (Signed)
Lingering intermittent discomfort along the incision site. As long as no evidence of infection I would monitor. Heating pad would probably help the most. Not sure continue steroid cream that much benefit at this point.

## 2013-06-27 NOTE — Telephone Encounter (Signed)
Pt is post-op TAH on 04/16/13 states yesterday at work her at her incision site she had some pain took some OTC medication and pain was gone. Site still very tender with clothes and feels irritated at times, no drainage of discolor at site. Pt ran out betamethasone asked if she should still be using this? No pain today still feels tender, but no pain. Please advise

## 2013-06-28 NOTE — Telephone Encounter (Signed)
Pt informed with the below note. 

## 2013-07-27 ENCOUNTER — Ambulatory Visit (INDEPENDENT_AMBULATORY_CARE_PROVIDER_SITE_OTHER): Payer: BC Managed Care – PPO | Admitting: Gynecology

## 2013-07-27 ENCOUNTER — Encounter: Payer: Self-pay | Admitting: Gynecology

## 2013-07-27 DIAGNOSIS — L290 Pruritus ani: Secondary | ICD-10-CM

## 2013-07-27 DIAGNOSIS — N898 Other specified noninflammatory disorders of vagina: Secondary | ICD-10-CM

## 2013-07-27 DIAGNOSIS — Z113 Encounter for screening for infections with a predominantly sexual mode of transmission: Secondary | ICD-10-CM

## 2013-07-27 LAB — WET PREP FOR TRICH, YEAST, CLUE
TRICH WET PREP: NONE SEEN
WBC, Wet Prep HPF POC: NONE SEEN
YEAST WET PREP: NONE SEEN

## 2013-07-27 MED ORDER — FLUCONAZOLE 200 MG PO TABS: 200.0000 mg | ORAL_TABLET | Freq: Every day | ORAL | Status: DC

## 2013-07-27 MED ORDER — NYSTATIN-TRIAMCINOLONE 100000-0.1 UNIT/GM-% EX OINT: 1.0000 "application " | TOPICAL_OINTMENT | Freq: Two times a day (BID) | CUTANEOUS | Status: DC

## 2013-07-27 MED ORDER — METRONIDAZOLE 500 MG PO TABS: 500.0000 mg | ORAL_TABLET | Freq: Two times a day (BID) | ORAL | Status: DC

## 2013-07-27 NOTE — Patient Instructions (Signed)
Take Flagyl medication twice daily for 7 days. Avoid alcohol while taking. Take Diflucan pill daily for 5 days. Apply cream to the perianal area as needed. Followup if symptoms persist, worsen or recur.

## 2013-07-27 NOTE — Progress Notes (Signed)
Patient presents with several week history of vaginal odor and anal itching. Slight discharge. Used some MetroGel she had at home. No dysuria frequency urgency. Also asked about STD screening. No known exposure but wants to be screened for [facility] and chlamydia.  Exam with Maudie Mercury assistant Abdomen soft with incision well-healed. Prior rash completely resolved. Pelvic external BUS vagina with thick white cakey discharge. Wet prep done. GC chlamydia screen of the vagina and urethra done. Bimanual exam without masses or tenderness.  Assessment and plan: Wet prep is positive for bacterial vaginosis. Clinically highly suspicious for yeast to include her complaints of pruritus a night. We'll treat with Diflucan 200 mg daily x5 days alcohol avoidance reviewed, Mytrex to the perianal area when necessary and Flagyl 500 mg twice a day x7 days. Followup if symptoms persist worsen or recur. Her prior allergic. Incisional rash has totally resolved. Incision has healed nicely.

## 2013-07-29 LAB — GC/CHLAMYDIA PROBE AMP
CT PROBE, AMP APTIMA: NEGATIVE
GC Probe RNA: NEGATIVE

## 2013-11-07 ENCOUNTER — Ambulatory Visit (INDEPENDENT_AMBULATORY_CARE_PROVIDER_SITE_OTHER): Payer: BC Managed Care – PPO | Admitting: Women's Health

## 2013-11-07 ENCOUNTER — Encounter: Payer: Self-pay | Admitting: Women's Health

## 2013-11-07 VITALS — BP 112/70 | Ht 66.0 in | Wt 184.0 lb

## 2013-11-07 DIAGNOSIS — B9689 Other specified bacterial agents as the cause of diseases classified elsewhere: Secondary | ICD-10-CM

## 2013-11-07 DIAGNOSIS — E039 Hypothyroidism, unspecified: Secondary | ICD-10-CM

## 2013-11-07 DIAGNOSIS — Z13220 Encounter for screening for lipoid disorders: Secondary | ICD-10-CM

## 2013-11-07 DIAGNOSIS — N760 Acute vaginitis: Secondary | ICD-10-CM

## 2013-11-07 DIAGNOSIS — B009 Herpesviral infection, unspecified: Secondary | ICD-10-CM

## 2013-11-07 DIAGNOSIS — A499 Bacterial infection, unspecified: Secondary | ICD-10-CM

## 2013-11-07 DIAGNOSIS — Z01419 Encounter for gynecological examination (general) (routine) without abnormal findings: Secondary | ICD-10-CM

## 2013-11-07 LAB — CBC WITH DIFFERENTIAL/PLATELET
BASOS PCT: 1 % (ref 0–1)
Basophils Absolute: 0 10*3/uL (ref 0.0–0.1)
EOS ABS: 0.1 10*3/uL (ref 0.0–0.7)
Eosinophils Relative: 3 % (ref 0–5)
HCT: 37.4 % (ref 36.0–46.0)
Hemoglobin: 13.2 g/dL (ref 12.0–15.0)
Lymphocytes Relative: 33 % (ref 12–46)
Lymphs Abs: 1.3 10*3/uL (ref 0.7–4.0)
MCH: 29.9 pg (ref 26.0–34.0)
MCHC: 35.3 g/dL (ref 30.0–36.0)
MCV: 84.6 fL (ref 78.0–100.0)
Monocytes Absolute: 0.3 10*3/uL (ref 0.1–1.0)
Monocytes Relative: 7 % (ref 3–12)
NEUTROS PCT: 56 % (ref 43–77)
Neutro Abs: 2.1 10*3/uL (ref 1.7–7.7)
PLATELETS: 183 10*3/uL (ref 150–400)
RBC: 4.42 MIL/uL (ref 3.87–5.11)
RDW: 13.9 % (ref 11.5–15.5)
WBC: 3.8 10*3/uL — ABNORMAL LOW (ref 4.0–10.5)

## 2013-11-07 LAB — T4: T4, Total: 12.2 ug/dL (ref 5.0–12.5)

## 2013-11-07 LAB — LIPID PANEL
CHOLESTEROL: 210 mg/dL — AB (ref 0–200)
HDL: 43 mg/dL (ref >39)
LDL Cholesterol: 153 mg/dL — ABNORMAL HIGH (ref 0–99)
Total CHOL/HDL Ratio: 4.9 Ratio
Triglycerides: 69 mg/dL (ref <150)
VLDL: 14 mg/dL (ref 0–40)

## 2013-11-07 LAB — WET PREP FOR TRICH, YEAST, CLUE
TRICH WET PREP: NONE SEEN
WBC, Wet Prep HPF POC: NONE SEEN
Yeast Wet Prep HPF POC: NONE SEEN

## 2013-11-07 LAB — TSH: TSH: 1.268 u[IU]/mL (ref 0.350–4.500)

## 2013-11-07 LAB — T3 UPTAKE: T3 Uptake: 33.2 % (ref 22.5–37.0)

## 2013-11-07 MED ORDER — METRONIDAZOLE 0.75 % VA GEL: VAGINAL | Status: DC

## 2013-11-07 MED ORDER — VALACYCLOVIR HCL 500 MG PO TABS: 500.0000 mg | ORAL_TABLET | Freq: Two times a day (BID) | ORAL | Status: DC

## 2013-11-07 NOTE — Progress Notes (Signed)
Kendra Griffin [birth year] [id number]    History:    Presents for annual exam.  03/2013 TAH for menorrhagia and fibroids. History of normal Paps. Hyperthyroid, had iodine treatment 26-Nov-2011 per Dr. Chalmers Cater, currently on Synthroid 100 mcg, normal TSH March 2014 but has 8 lb weight gain. Recurrent BV, uses one applicator of MetroGel months with moderate relief of symptoms. History of HSV-2 with rare outbreaks.  Past medical history, past surgical history, family history and social history were all reviewed and documented in the EPIC chart. Myomectomy Nov 26, 2003. Kendra Griffin 6, Kendra Griffin 10 both doing well. Umbilical hernia repaired in 11/26/2006, and 11-25-09. Father died of stroke at 55.  ROS:  A  ROS was performed and pertinent positives and negatives are included.  Exam:  Filed Vitals:   11/07/13 1002  BP: 112/70    General appearance:  Normal Thyroid:  Symmetrical, normal in size, without palpable masses or nodularity. Respiratory  Auscultation:  Clear without wheezing or rhonchi Cardiovascular  Auscultation:  Regular rate, without rubs, murmurs or gallops  Edema/varicosities:  Not grossly evident Abdominal  Soft,nontender, without masses, guarding or rebound.  Liver/spleen:  No organomegaly noted  Hernia:  None appreciated, scar noted  Skin  Inspection:  Grossly normal   Breasts: Examined lying and sitting.     Right: Without masses, retractions, discharge or axillary adenopathy.     Left: Without masses, retractions, discharge or axillary adenopathy. Gentitourinary   Inguinal/mons:  Normal without inguinal adenopathy  External genitalia:  Normal  BUS/Urethra/Skene's glands:  Normal  Vagina: Wet prep positive for amines, clues, TNTC bacteria  Cervix:  Absent  Adnexa/parametria:     Rt: Without masses or tenderness.   Lt: Without masses or tenderness.  Anus and perineum: Normal Wet Prep: positive amines, moderate clue cells, many bacteria  Assessment/Plan:  38 y.o. SBF G3P2 for annual exam with  complaint mild vaginal discharge and odor.   TAH/fibroids- 2012-11-25 Hypothyroidism HSV 2-rare outbreaks Recurrent BV  Plan: MetroGel vaginal cream 1 applicator at bedtime x5 and then once monthly ,prescription, use, avoidance of alcohol discussed. Valtrex 500 twice daily for 3-5 days as needed. SBE's, continue regular exercise, calcium rich diet, vitamin D 1000 daily encouraged. Normal pap 09/2012, new guidelines reviewed.  UA, thyroid panel, lipid panel, CBC. Will take lab results to Dr. Chalmers Cater for review.   [place], 10:42 AM 11/07/2013

## 2013-11-07 NOTE — Patient Instructions (Signed)

## 2013-11-08 ENCOUNTER — Other Ambulatory Visit: Payer: Self-pay | Admitting: Gynecology

## 2013-11-08 ENCOUNTER — Other Ambulatory Visit: Payer: Self-pay | Admitting: Women's Health

## 2013-11-08 DIAGNOSIS — E7800 Pure hypercholesterolemia, unspecified: Secondary | ICD-10-CM

## 2013-11-08 LAB — URINALYSIS W MICROSCOPIC + REFLEX CULTURE
BILIRUBIN URINE: NEGATIVE
Bacteria, UA: NONE SEEN
Casts: NONE SEEN
Crystals: NONE SEEN
GLUCOSE, UA: NEGATIVE mg/dL
Hgb urine dipstick: NEGATIVE
Ketones, ur: NEGATIVE mg/dL
Leukocytes, UA: NEGATIVE
Nitrite: NEGATIVE
Protein, ur: NEGATIVE mg/dL
SPECIFIC GRAVITY, URINE: 1.019 (ref 1.005–1.030)
Urobilinogen, UA: 1 mg/dL (ref 0.0–1.0)
pH: 7 (ref 5.0–8.0)

## 2014-01-30 ENCOUNTER — Ambulatory Visit (INDEPENDENT_AMBULATORY_CARE_PROVIDER_SITE_OTHER): Payer: BC Managed Care – PPO | Admitting: Women's Health

## 2014-01-30 ENCOUNTER — Encounter: Payer: Self-pay | Admitting: Women's Health

## 2014-01-30 DIAGNOSIS — N760 Acute vaginitis: Secondary | ICD-10-CM

## 2014-01-30 DIAGNOSIS — N390 Urinary tract infection, site not specified: Secondary | ICD-10-CM

## 2014-01-30 DIAGNOSIS — B9689 Other specified bacterial agents as the cause of diseases classified elsewhere: Secondary | ICD-10-CM

## 2014-01-30 DIAGNOSIS — A499 Bacterial infection, unspecified: Secondary | ICD-10-CM

## 2014-01-30 LAB — URINALYSIS W MICROSCOPIC + REFLEX CULTURE
Bilirubin Urine: NEGATIVE
Glucose, UA: NEGATIVE mg/dL
Hgb urine dipstick: NEGATIVE
KETONES UR: NEGATIVE mg/dL
Leukocytes, UA: NEGATIVE
NITRITE: NEGATIVE
PROTEIN: 30 mg/dL — AB
SPECIFIC GRAVITY, URINE: 1.015 (ref 1.005–1.030)
UROBILINOGEN UA: 2 mg/dL — AB (ref 0.0–1.0)
pH: 8.5 — ABNORMAL HIGH (ref 5.0–8.0)

## 2014-01-30 LAB — WET PREP FOR TRICH, YEAST, CLUE
TRICH WET PREP: NONE SEEN
WBC, Wet Prep HPF POC: NONE SEEN
Yeast Wet Prep HPF POC: NONE SEEN

## 2014-01-30 MED ORDER — METRONIDAZOLE 500 MG PO TABS: 500.0000 mg | ORAL_TABLET | Freq: Two times a day (BID) | ORAL | Status: DC

## 2014-01-30 NOTE — Addendum Note (Signed)
Addended by: Thamas Jaegers on: 01/30/2014 03:31 PM   Modules accepted: Orders

## 2014-01-30 NOTE — Patient Instructions (Signed)
Bacterial Vaginosis Bacterial vaginosis is an infection of the vagina. It happens when too many of certain germs (bacteria) grow in the vagina. HOME CARE  Take your medicine as told by your doctor.  Finish your medicine even if you start to feel better.  Do not have sex until you finish your medicine and are better.  Tell your sex partner that you have an infection. They should see their doctor for treatment.  Practice safe sex. Use condoms. Have only one sex partner. GET HELP IF:  You are not getting better after 3 days of treatment.  You have more grey fluid (discharge) coming from your vagina than before.  You have more pain than before.  You have a fever. MAKE SURE YOU:   Understand these instructions.  Will watch your condition.  Will get help right away if you are not doing well or get worse. Document Released: 04/20/2008 Document Revised: 05/02/2013 Document Reviewed: 02/21/2013 ExitCare Patient Information 2015 [employer]. This information is not intended to replace advice given to you by your health care provider. Make sure you discuss any questions you have with your health care provider.  

## 2014-01-30 NOTE — Progress Notes (Signed)
Patient ID: Kendra Griffin, female   DOB: 25-Dec-1975, 38 y.o.   MRN: [id number] Presents  with complaint of vaginal discharge requesting STD screen. Negative screen 07/2013, same partner. TAH for fibroids. Has had problems with recurrent BV. Urinary frequency, denies pain or burning with urination, abdominal pain or fever.  Exam: Appears well. External genitalia within normal limits, speculum exam scant discharge,  minimal erythema, wet prep positive for amines, clues, and TNTC bacteria. Bimanual no adnexal fullness or tenderness. UA: Negative  Recurrent Bacteria vaginosis  Plan: Flagyl 500 twice daily for 7 days, alcohol precautions reviewed. Has used MetroGel in the past. Instructed to call if no relief of symptoms, will try Boric Acid gel caps 600 mg  twice weekly vaginally, prescription, proper use given and reviewed. Reviewed if continued problems return to office. Encouraged condoms until permanent partner.

## 2014-05-27 ENCOUNTER — Encounter: Payer: Self-pay | Admitting: Women's Health

## 2014-05-31 ENCOUNTER — Encounter: Payer: Self-pay | Admitting: Women's Health

## 2014-05-31 ENCOUNTER — Ambulatory Visit (INDEPENDENT_AMBULATORY_CARE_PROVIDER_SITE_OTHER): Payer: BC Managed Care – PPO | Admitting: Women's Health

## 2014-05-31 VITALS — BP 130/80 | Ht 66.0 in | Wt 180.0 lb

## 2014-05-31 DIAGNOSIS — A499 Bacterial infection, unspecified: Secondary | ICD-10-CM

## 2014-05-31 DIAGNOSIS — E7800 Pure hypercholesterolemia, unspecified: Secondary | ICD-10-CM

## 2014-05-31 DIAGNOSIS — B9689 Other specified bacterial agents as the cause of diseases classified elsewhere: Secondary | ICD-10-CM

## 2014-05-31 DIAGNOSIS — N760 Acute vaginitis: Secondary | ICD-10-CM

## 2014-05-31 DIAGNOSIS — N898 Other specified noninflammatory disorders of vagina: Secondary | ICD-10-CM

## 2014-05-31 LAB — WET PREP FOR TRICH, YEAST, CLUE
TRICH WET PREP: NONE SEEN
WBC WET PREP: NONE SEEN
Yeast Wet Prep HPF POC: NONE SEEN

## 2014-05-31 MED ORDER — METRONIDAZOLE 0.75 % VA GEL: VAGINAL | Status: DC

## 2014-05-31 NOTE — Patient Instructions (Signed)
Bacterial Vaginosis Bacterial vaginosis is an infection of the vagina. It happens when too many of certain germs (bacteria) grow in the vagina. HOME CARE  Take your medicine as told by your doctor.  Finish your medicine even if you start to feel better.  Do not have sex until you finish your medicine and are better.  Tell your sex partner that you have an infection. They should see their doctor for treatment.  Practice safe sex. Use condoms. Have only one sex partner. GET HELP IF:  You are not getting better after 3 days of treatment.  You have more grey fluid (discharge) coming from your vagina than before.  You have more pain than before.  You have a fever. MAKE SURE YOU:   Understand these instructions.  Will watch your condition.  Will get help right away if you are not doing well or get worse. Document Released: 04/20/2008 Document Revised: 05/02/2013 Document Reviewed: 02/21/2013 ExitCare Patient Information 2015 [employer]. This information is not intended to replace advice given to you by your health care provider. Make sure you discuss any questions you have with your health care provider.  

## 2014-05-31 NOTE — Progress Notes (Signed)
Patient ID: Kendra Griffin, female   DOB: 08/29/1975, 38 y.o.   MRN: [id number] Presents with complaint of vaginal discharge with odor, had been trying boric acid gelcaps which states has caused more odor. Had been doing MetroGel 1 applicator every 1-2 weeks with better relief. Has had problems with chronic BV. Same partner. Hysterectomy/fibroids 9/20`4 and has felt much better since surgery. Denies vaginal itching, abdominal pain or urinary symptoms.  Exam: Appears well. External genitalia within normal limits, speculum exam scant discharge with minimal odor noted, wet prep positive for amines, many clues, TNTC bacteria. Bimanual no adnexal tenderness or fullness.  Bacteria vaginosis  Plan: MetroGel vaginal cream 1 applicator at bedtime 5, alcohol precautions reviewed, will try 1 applicator every other week, then decrease to half applicator every other week instructed to call if continued symptoms.

## 2014-06-01 LAB — LIPID PANEL
CHOLESTEROL: 171 mg/dL (ref 0–200)
HDL: 42 mg/dL (ref >39)
LDL Cholesterol: 117 mg/dL — ABNORMAL HIGH (ref 0–99)
TRIGLYCERIDES: 59 mg/dL (ref <150)
Total CHOL/HDL Ratio: 4.1 Ratio
VLDL: 12 mg/dL (ref 0–40)

## 2014-06-13 ENCOUNTER — Other Ambulatory Visit: Payer: Self-pay | Admitting: Women's Health

## 2014-06-13 ENCOUNTER — Telehealth: Payer: Self-pay | Admitting: *Deleted

## 2014-06-13 DIAGNOSIS — B9689 Other specified bacterial agents as the cause of diseases classified elsewhere: Secondary | ICD-10-CM

## 2014-06-13 DIAGNOSIS — N760 Acute vaginitis: Principal | ICD-10-CM

## 2014-06-13 MED ORDER — METRONIDAZOLE 500 MG PO TABS: 500.0000 mg | ORAL_TABLET | Freq: Two times a day (BID) | ORAL | Status: DC

## 2014-06-13 NOTE — Telephone Encounter (Signed)
Telephone call, states Flagyl pills help better than MetroGel, will take Flagyl 500 twice daily for 7 days, alcohol precautions reviewed. Instructed to call if no relief of symptoms. Has had problems with recurrent BV, no relief with boric acid.

## 2014-06-13 NOTE — Telephone Encounter (Signed)
Pt calling to follow up from [place] 05/31/14 states used metrogel x 5 nights as directed which normally would take odor away. Odor not as bad but still there, directions said use weekly PRN after x 5 night I did relay this to patient as well. She asked me to relay information to you as well. Do you want pt to continue weekly? Do another round of 5 nights? Please advise

## 2014-07-30 IMAGING — CR DG ABDOMEN 1V
1 series · 1 of 1 positions shown · non-contrast
Comparison: None.

CLINICAL DATA: Confirm IUD location

ABDOMEN - 1 VIEW

[t abdomen supine]
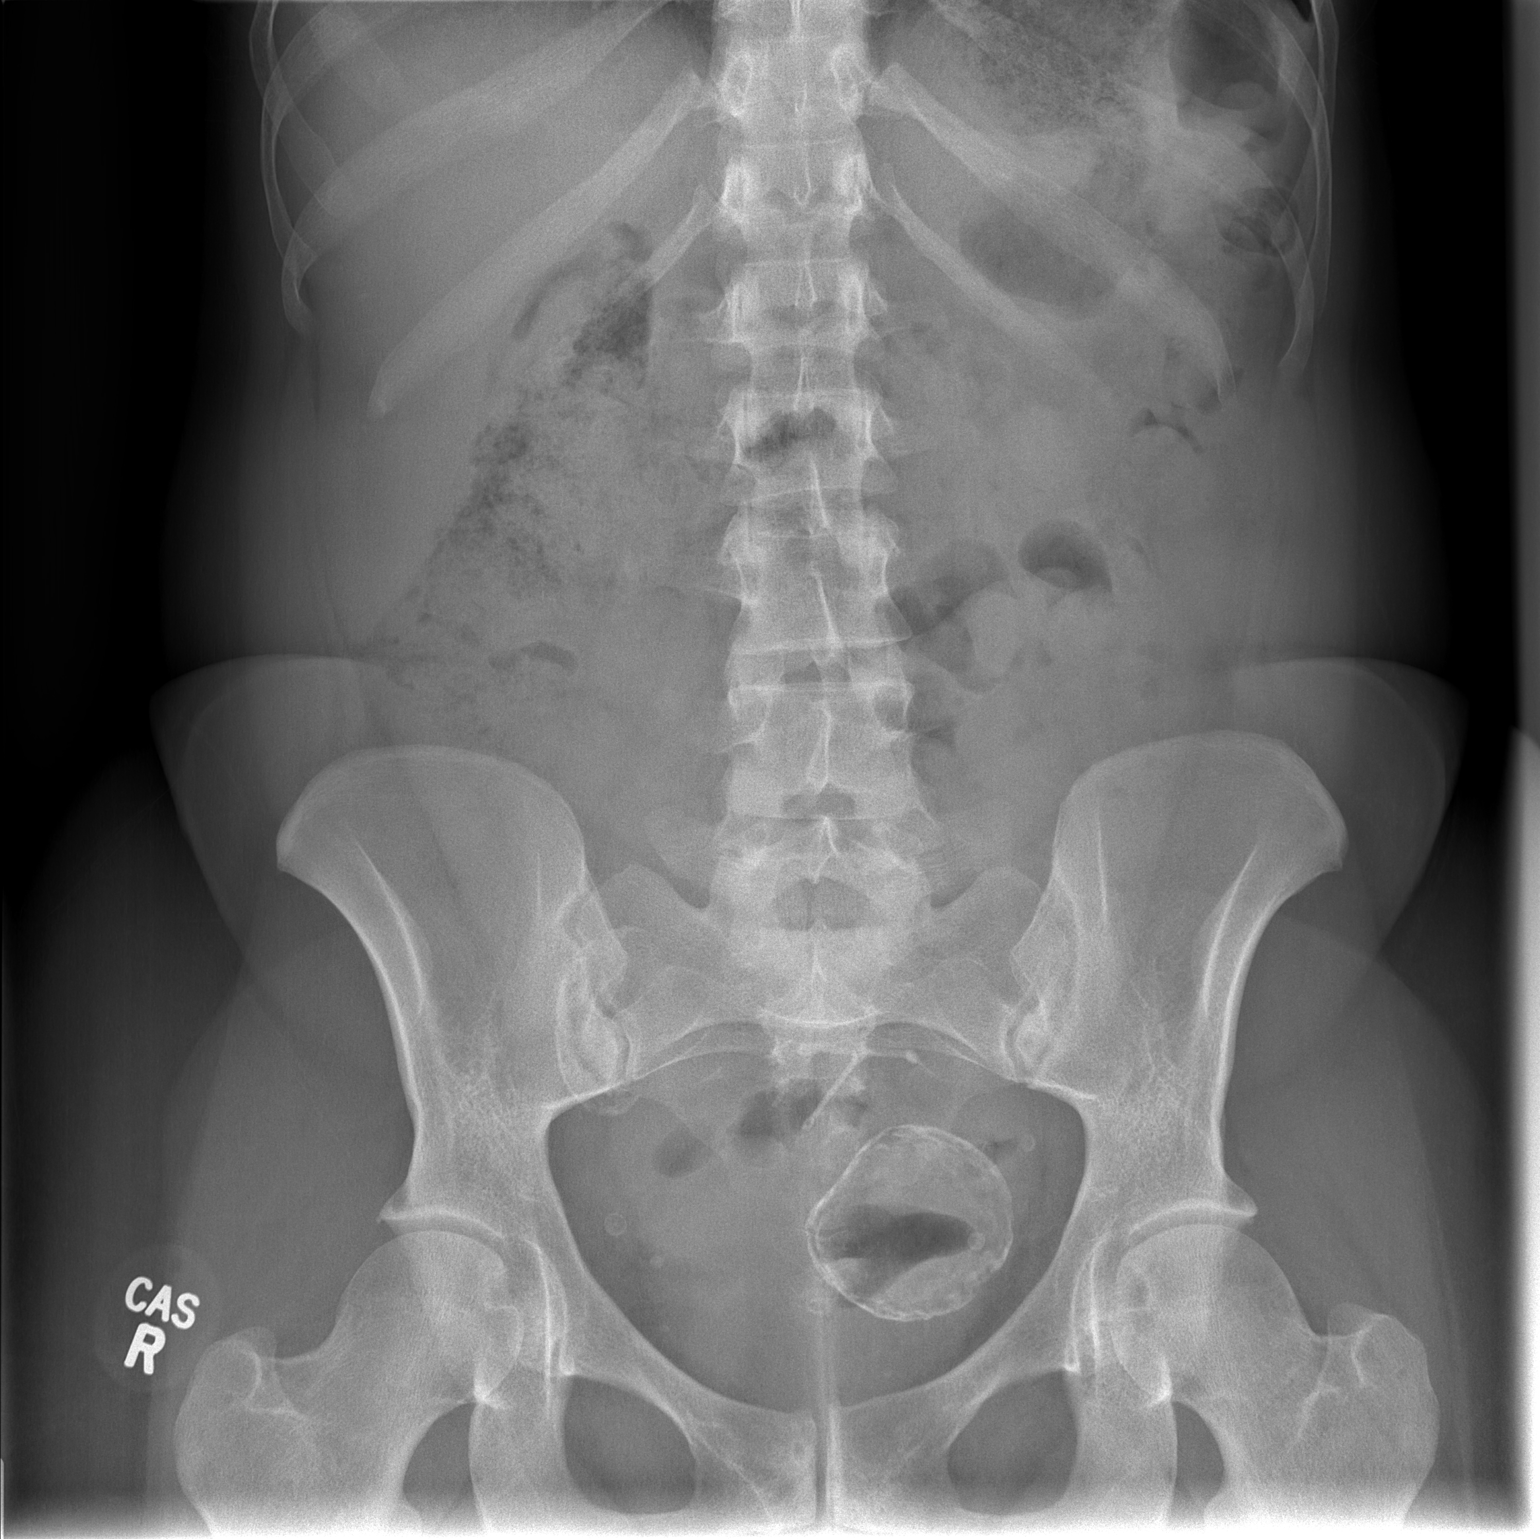

[1 of 1 positions shown; findings below may reference images not displayed]

FINDINGS: Intrauterine device is in the mid pelvis.  To the left of
the intrauterine device, there is a calcified mass measuring 5.6 x
5.8 cm consistent with a leiomyoma.

There is stool throughout the colon.  The bowel gas pattern is
normal.  No obstruction or free air is seen on this supine
examination.  There are small phleboliths in the pelvis.
IMPRESSION: Intrauterine device is present in the mid pelvis.  In the left
pelvis, there is a focal calcification felt to represent a
leiomyoma measuring 5.8 x 5.6 cm.

Large amount of stool.  Bowel gas pattern unremarkable.

## 2014-10-30 ENCOUNTER — Ambulatory Visit: Payer: Self-pay | Admitting: Women's Health

## 2014-11-01 ENCOUNTER — Encounter: Payer: Self-pay | Admitting: Women's Health

## 2014-11-01 ENCOUNTER — Ambulatory Visit (INDEPENDENT_AMBULATORY_CARE_PROVIDER_SITE_OTHER): Payer: BLUE CROSS/BLUE SHIELD | Admitting: Women's Health

## 2014-11-01 DIAGNOSIS — Z01810 Encounter for preprocedural cardiovascular examination: Secondary | ICD-10-CM

## 2014-11-01 LAB — COMPREHENSIVE METABOLIC PANEL
ALT: 11 U/L (ref 0–35)
AST: 14 U/L (ref 0–37)
Albumin: 4.1 g/dL (ref 3.5–5.2)
Alkaline Phosphatase: 54 U/L (ref 39–117)
BUN: 9 mg/dL (ref 6–23)
CO2: 27 mEq/L (ref 19–32)
Calcium: 8.9 mg/dL (ref 8.4–10.5)
Chloride: 103 mEq/L (ref 96–112)
Creat: 0.88 mg/dL (ref 0.50–1.10)
Glucose, Bld: 84 mg/dL (ref 70–99)
POTASSIUM: 4 meq/L (ref 3.5–5.3)
SODIUM: 138 meq/L (ref 135–145)
TOTAL PROTEIN: 6.7 g/dL (ref 6.0–8.3)
Total Bilirubin: 0.4 mg/dL (ref 0.2–1.2)

## 2014-11-01 LAB — CBC WITH DIFFERENTIAL/PLATELET
Basophils Absolute: 0 10*3/uL (ref 0.0–0.1)
Basophils Relative: 0 % (ref 0–1)
Eosinophils Absolute: 0.1 10*3/uL (ref 0.0–0.7)
Eosinophils Relative: 3 % (ref 0–5)
HEMATOCRIT: 36.8 % (ref 36.0–46.0)
HEMOGLOBIN: 12.6 g/dL (ref 12.0–15.0)
LYMPHS PCT: 36 % (ref 12–46)
Lymphs Abs: 1.3 10*3/uL (ref 0.7–4.0)
MCH: 29.8 pg (ref 26.0–34.0)
MCHC: 34.2 g/dL (ref 30.0–36.0)
MCV: 87 fL (ref 78.0–100.0)
MONO ABS: 0.2 10*3/uL (ref 0.1–1.0)
MONOS PCT: 6 % (ref 3–12)
MPV: 9.4 fL (ref 8.6–12.4)
Neutro Abs: 1.9 10*3/uL (ref 1.7–7.7)
Neutrophils Relative %: 55 % (ref 43–77)
Platelets: 189 10*3/uL (ref 150–400)
RBC: 4.23 MIL/uL (ref 3.87–5.11)
RDW: 13.7 % (ref 11.5–15.5)
WBC: 3.5 10*3/uL — AB (ref 4.0–10.5)

## 2014-11-01 NOTE — Progress Notes (Signed)
Patient ID: Kendra Griffin, female   DOB: 1976-03-30, 39 y.o.   MRN: [id number]  Presents for pre-op  bloodwork for elective abdominoplasty. Strongly dislikes appearance of stretch marks, scars on abdomen. History of fibroid removal, TAH, C-section, and hernia repairs. 2 daughters. Taking phentermine occasionally.  Exam: Abdominal stretch marks, lower abdominal scarring noted  Pre-op bloodwork   Plan: CBC, BMP, PT, PTT, INR. Reviewed risks of surgery including anesthesia risks, infection, scarring. Emphasized need for rest following procedure. Instructed to print off results from my chart to take to plastic surgeon's office prior to surgery.

## 2014-11-01 NOTE — Patient Instructions (Signed)
Outpatient Surgery Guidelines Outpatient procedures are those for which the person having the procedure is allowed to go home the same day as the procedure. Various procedures are done on an outpatient basis. You should follow some general guidelines if you will be having an outpatient procedure. LET [facility] CARE PROVIDER KNOW ABOUT:  Any allergies you have.  All medicines you are taking, including vitamins, herbs, eye drops, creams, and over-the-counter medicines.  Previous problems you or members of your family have had with the use of anesthetics.  Any blood disorders you have.  Previous surgeries you have had.  Medical conditions you have. RISKS AND COMPLICATIONS Your health care provider will discuss possible risks and complications with you before surgery. Common risks and complications include:   Problems due to the use of anesthetics.  Blood loss and replacement (does not apply to minor surgical procedures).  Temporary increase in pain due to surgery.  Uncorrected pain or problems that the surgery was meant to correct.  Infection.  New damage. BEFORE THE PROCEDURE  Ask your health care provider about changing or stopping your regular medicines. You may need to stop taking certain medicines in the days or weeks before the procedure.  Stop smoking at least 2 weeks before surgery. This lowers your risk for complications during and after surgery. Ask your health care provider for help with this if needed.  Eat your usual meals and a light supper the day before surgery. Continue fluid intake. Do not drink alcohol.  Do not eat or drink after midnight the night before your surgery. Take your usual medicine the morning of surgery with a sip of water unless instructed otherwise. Check with your health care provider if you are unsure. This is particularly important if you take diabetes medicine.  Arrange for someone to take you home and to stay with you for 24 hours after the  procedure. Medicine given for your procedure may affect your ability to drive or to care for yourself.  Call your health care provider's office if you develop an illness or problem that may prevent you from safely having your procedure. AFTER THE PROCEDURE After surgery, you will be taken to a recovery area, where your progress will be monitored. If there are no complications, you will be allowed to go home when you are awake, stable, and taking fluids well. You may have numbness around the surgical site. Healing will take some time. You will have tenderness at the surgical site and may have some swelling and bruising. You may also have some nausea. HOME CARE INSTRUCTIONS  Do not drive for 24 hours, or as directed by your health care provider. Do not drive while taking prescription pain medicines.  Do not drink alcohol for 24 hours.  Do not make important decisions or sign legal documents for 24 hours.  You may resume a normal diet and activities as directed.  Do not lift anything heavier than 10 pounds (4.5 kg) or play contact sports until your health care provider says it is okay.  Change your bandages (dressings) as directed.  Only take over-the-counter or prescription medicines as directed by your health care provider.  Follow up with your health care provider as directed. SEEK MEDICAL CARE IF:  You have increased bleeding (more than a small spot) from the surgical site.  You have redness, swelling, or increasing pain in the wound.  You see pus coming from the wound.  You have a fever.  You notice a bad smell coming  from the wound or dressing.  You feel lightheaded or faint.  You develop a rash.  You have trouble breathing.  You develop allergies. MAKE SURE YOU:  Understand these instructions.  Will watch your condition.  Will get help right away if you are not doing well or get worse. Document Released: 04/06/2001 Document Revised: 07/17/2013 Document Reviewed:  12/14/2012 [facility] Patient Information 2015 [place], [place]. This information is not intended to replace advice given to you by your health care provider. Make sure you discuss any questions you have with your health care provider.

## 2014-11-02 LAB — APTT: aPTT: 33 seconds (ref 24–37)

## 2014-11-02 LAB — PROTIME-INR
INR: 1.04 (ref <1.50)
Prothrombin Time: 13.6 seconds (ref 11.6–15.2)

## 2014-11-13 ENCOUNTER — Ambulatory Visit (INDEPENDENT_AMBULATORY_CARE_PROVIDER_SITE_OTHER): Payer: BLUE CROSS/BLUE SHIELD | Admitting: Women's Health

## 2014-11-13 ENCOUNTER — Encounter: Payer: Self-pay | Admitting: Women's Health

## 2014-11-13 VITALS — BP 140/80 | Ht 66.0 in | Wt 192.0 lb

## 2014-11-13 DIAGNOSIS — B009 Herpesviral infection, unspecified: Secondary | ICD-10-CM

## 2014-11-13 DIAGNOSIS — B9689 Other specified bacterial agents as the cause of diseases classified elsewhere: Secondary | ICD-10-CM

## 2014-11-13 DIAGNOSIS — Z13220 Encounter for screening for lipoid disorders: Secondary | ICD-10-CM

## 2014-11-13 DIAGNOSIS — N760 Acute vaginitis: Secondary | ICD-10-CM

## 2014-11-13 DIAGNOSIS — Z01419 Encounter for gynecological examination (general) (routine) without abnormal findings: Secondary | ICD-10-CM

## 2014-11-13 DIAGNOSIS — Z113 Encounter for screening for infections with a predominantly sexual mode of transmission: Secondary | ICD-10-CM | POA: Diagnosis not present

## 2014-11-13 DIAGNOSIS — A499 Bacterial infection, unspecified: Secondary | ICD-10-CM

## 2014-11-13 LAB — CBC WITH DIFFERENTIAL/PLATELET
BASOS ABS: 0 10*3/uL (ref 0.0–0.1)
BASOS PCT: 0 % (ref 0–1)
Eosinophils Absolute: 0 10*3/uL (ref 0.0–0.7)
Eosinophils Relative: 1 % (ref 0–5)
HCT: 36.9 % (ref 36.0–46.0)
Hemoglobin: 12.3 g/dL (ref 12.0–15.0)
LYMPHS PCT: 30 % (ref 12–46)
Lymphs Abs: 1.1 10*3/uL (ref 0.7–4.0)
MCH: 30 pg (ref 26.0–34.0)
MCHC: 33.3 g/dL (ref 30.0–36.0)
MCV: 90 fL (ref 78.0–100.0)
MPV: 9.8 fL (ref 8.6–12.4)
Monocytes Absolute: 0.2 10*3/uL (ref 0.1–1.0)
Monocytes Relative: 6 % (ref 3–12)
NEUTROS ABS: 2.3 10*3/uL (ref 1.7–7.7)
NEUTROS PCT: 63 % (ref 43–77)
Platelets: 173 10*3/uL (ref 150–400)
RBC: 4.1 MIL/uL (ref 3.87–5.11)
RDW: 14.4 % (ref 11.5–15.5)
WBC: 3.6 10*3/uL — AB (ref 4.0–10.5)

## 2014-11-13 LAB — LIPID PANEL
CHOL/HDL RATIO: 3.8 ratio
CHOLESTEROL: 194 mg/dL (ref 0–200)
HDL: 51 mg/dL (ref >=46)
LDL Cholesterol: 133 mg/dL — ABNORMAL HIGH (ref 0–99)
Triglycerides: 52 mg/dL (ref <150)
VLDL: 10 mg/dL (ref 0–40)

## 2014-11-13 MED ORDER — METRONIDAZOLE 0.75 % VA GEL: VAGINAL | Status: DC

## 2014-11-13 MED ORDER — VALACYCLOVIR HCL 500 MG PO TABS: ORAL_TABLET | ORAL | Status: DC

## 2014-11-13 NOTE — Patient Instructions (Signed)

## 2014-11-13 NOTE — Progress Notes (Signed)
Kendra Griffin 04/26/1976 [id number]    History:    Presents for annual exam.  2014 TAH for menorrhagia and fibroids. Normal Pap history. Planning to have elective abdominoplasty May 12 for scarring from TAH, C-section, and hernia repairs, pre-op bloodwork/normal completed 11/01/14. Hypothyroidism, taking Synthroid 100 mcg and had iodine treatment 2013, TSH normal 4/15, fluctuations in weight, managed by Dr. Chalmers Cater. Recurrent BV, uses MetroGel with relief. Rare HSV-2 outbreaks, Valtrex as needed. Same partner, requests STD testing today.  Past medical history, past surgical history, family history and social history were all reviewed and documented in the EPIC chart. Works weekends. 2 daughters, [name] and Luetta Nutting, ages 73 and 36, doing well.   ROS:  A ROS was performed and pertinent positives and negatives are included.  Exam:  Filed Vitals:   11/13/14 0905  BP: 140/80    General appearance:  Normal Thyroid:  Symmetrical, normal in size, without palpable masses or nodularity. Respiratory  Auscultation:  Clear without wheezing or rhonchi Cardiovascular  Auscultation:  Regular rate, without rubs, murmurs or gallops  Edema/varicosities:  Not grossly evident Abdominal  Soft,nontender, without masses, guarding or rebound.  Liver/spleen:  No organomegaly noted  Hernia:  None appreciated  Skin  Inspection:  Grossly normal   Breasts: Examined lying and sitting.     Right: Without masses, retractions, discharge or axillary adenopathy.     Left: Without masses, retractions, discharge or axillary adenopathy. Gentitourinary   Inguinal/mons:  Normal without inguinal adenopathy  External genitalia:  Normal  BUS/Urethra/Skene's glands:  Normal  Vagina:  Normal  Cervix:  Absent  Uterus:  Absent  Adnexa/parametria:     Rt: Without masses or tenderness.   Lt: Without masses or tenderness.  Anus and perineum: Normal  Digital rectal exam: Normal sphincter tone without palpated masses or  tenderness  Assessment/Plan:  39 y.o. SBF G3P2 for annual exam.   2014 TAH - menorrhagia and fibroids Hypothyroidism, Synthroid 100 mcg, Dr. Chalmers Cater manages labs and meds Rare HSV-2 outbreaks Recurrent BV with good relief with occasional MetroGel usage. STD screen per request  Plan: SBEs, Vit D 2000, calcium-rich diet, decreasing calories and increasing exercise for weight loss encouraged. MetroGel vaginal cream, 1 applicator prescribed to be used as needed, alcohol precautions reviewed. Valtrex BID x 3-5 days as needed for HSV-2 outbreaks or prior to stressful events. Normal Pap 2014, s/p TAH. G/C urethral swab, UA, Hep B/C, HIV, RPR, CBC, and lipid panel. Continue with Synthroid 100 mcg per Dr. Chalmers Cater. Blood pressure slightly elevated, instructed to check away from office and if it persists elevated greater than 130/80 to follow-up with primary care.   Huel Cote WHNP, 10:02 AM 11/13/2014

## 2014-11-14 LAB — URINALYSIS W MICROSCOPIC + REFLEX CULTURE
Bacteria, UA: NONE SEEN
Bilirubin Urine: NEGATIVE
Casts: NONE SEEN
Crystals: NONE SEEN
GLUCOSE, UA: NEGATIVE mg/dL
Hgb urine dipstick: NEGATIVE
Ketones, ur: NEGATIVE mg/dL
Leukocytes, UA: NEGATIVE
Nitrite: NEGATIVE
Protein, ur: NEGATIVE mg/dL
Specific Gravity, Urine: 1.018 (ref 1.005–1.030)
Urobilinogen, UA: 1 mg/dL (ref 0.0–1.0)
pH: 6.5 (ref 5.0–8.0)

## 2014-11-14 LAB — GC/CHLAMYDIA PROBE AMP
CT Probe RNA: NEGATIVE
GC PROBE AMP APTIMA: NEGATIVE

## 2014-11-14 LAB — HEPATITIS B SURFACE ANTIGEN: Hepatitis B Surface Ag: NEGATIVE

## 2014-11-14 LAB — RPR

## 2014-11-14 LAB — HIV ANTIBODY (ROUTINE TESTING W REFLEX): HIV: NONREACTIVE

## 2014-11-14 LAB — HEPATITIS C ANTIBODY: HCV Ab: NEGATIVE

## 2015-01-01 ENCOUNTER — Ambulatory Visit (INDEPENDENT_AMBULATORY_CARE_PROVIDER_SITE_OTHER): Payer: BLUE CROSS/BLUE SHIELD | Admitting: Women's Health

## 2015-01-01 ENCOUNTER — Encounter: Payer: Self-pay | Admitting: Women's Health

## 2015-01-01 DIAGNOSIS — A499 Bacterial infection, unspecified: Secondary | ICD-10-CM | POA: Diagnosis not present

## 2015-01-01 DIAGNOSIS — B9689 Other specified bacterial agents as the cause of diseases classified elsewhere: Secondary | ICD-10-CM

## 2015-01-01 DIAGNOSIS — N3000 Acute cystitis without hematuria: Secondary | ICD-10-CM

## 2015-01-01 DIAGNOSIS — R350 Frequency of micturition: Secondary | ICD-10-CM | POA: Diagnosis not present

## 2015-01-01 DIAGNOSIS — N760 Acute vaginitis: Secondary | ICD-10-CM | POA: Diagnosis not present

## 2015-01-01 LAB — URINALYSIS W MICROSCOPIC + REFLEX CULTURE
Bilirubin Urine: NEGATIVE
Casts: NONE SEEN
Crystals: NONE SEEN
Glucose, UA: NEGATIVE mg/dL
Hgb urine dipstick: NEGATIVE
Ketones, ur: NEGATIVE mg/dL
Nitrite: NEGATIVE
Protein, ur: NEGATIVE mg/dL
Specific Gravity, Urine: 1.015 (ref 1.005–1.030)
Urobilinogen, UA: 1 mg/dL (ref 0.0–1.0)
pH: 8.5 — ABNORMAL HIGH (ref 5.0–8.0)

## 2015-01-01 LAB — WET PREP FOR TRICH, YEAST, CLUE
TRICH WET PREP: NONE SEEN
WBC, Wet Prep HPF POC: NONE SEEN
Yeast Wet Prep HPF POC: NONE SEEN

## 2015-01-01 MED ORDER — CIPROFLOXACIN HCL 250 MG PO TABS: 250.0000 mg | ORAL_TABLET | Freq: Two times a day (BID) | ORAL | Status: DC

## 2015-01-01 MED ORDER — METRONIDAZOLE 0.75 % VA GEL: VAGINAL | Status: DC

## 2015-01-01 NOTE — Progress Notes (Signed)
Patient ID: Kendra Griffin, female   DOB: 11/20/1975, 39 y.o.   MRN: [id number] Presents with complaint of increased urinary urgency, frequency and odor with urine for several days. Also increased vaginal discharge. Same partner. 2014 TAH for fibroids. Tummy tuck 3 weeks ago and doing well from that. Abdominal discomfort on right side near incisional line. Reports normal bowel movements, denies fever.  Exam: Appears well. Tummy tuck incisional line well healed, well approximated with no erythema noted. External genitalia within normal limits, speculum exam moderate amount of a white discharge with odor noted, wet prep positive for amines, clues, TNTC bacteria.  UA: Small leukocytes, 11-20 WBCs, few bacteria.  Probable UTI Bacteria vaginosis  Plan: Cipro 250 twice daily for 3 days #6, urine culture pending. MetroGel vaginal cream 1 applicator at bedtime 5, instructed to call if no relief of symptoms.

## 2015-01-01 NOTE — Patient Instructions (Signed)
Bacterial Vaginosis Bacterial vaginosis is a vaginal infection that occurs when the normal balance of bacteria in the vagina is disrupted. It results from an overgrowth of certain bacteria. This is the most common vaginal infection in women of childbearing age. Treatment is important to prevent complications, especially in pregnant women, as it can cause a premature delivery. CAUSES  Bacterial vaginosis is caused by an increase in harmful bacteria that are normally present in smaller amounts in the vagina. Several different kinds of bacteria can cause bacterial vaginosis. However, the reason that the condition develops is not fully understood. RISK FACTORS Certain activities or behaviors can put you at an increased risk of developing bacterial vaginosis, including:  Having a new sex partner or multiple sex partners.  Douching.  Using an intrauterine device (IUD) for contraception. Women do not get bacterial vaginosis from toilet seats, bedding, swimming pools, or contact with objects around them. SIGNS AND SYMPTOMS  Some women with bacterial vaginosis have no signs or symptoms. Common symptoms include:  Grey vaginal discharge.  A fishlike odor with discharge, especially after sexual intercourse.  Itching or burning of the vagina and vulva.  Burning or pain with urination. DIAGNOSIS  Your health care provider will take a medical history and examine the vagina for signs of bacterial vaginosis. A sample of vaginal fluid may be taken. Your health care provider will look at this sample under a microscope to check for bacteria and abnormal cells. A vaginal pH test may also be done.  TREATMENT  Bacterial vaginosis may be treated with antibiotic medicines. These may be given in the form of a pill or a vaginal cream. A second round of antibiotics may be prescribed if the condition comes back after treatment.  HOME CARE INSTRUCTIONS   Only take over-the-counter or prescription medicines as  directed by your health care provider.  If antibiotic medicine was prescribed, take it as directed. Make sure you finish it even if you start to feel better.  Do not have sex until treatment is completed.  Tell all sexual partners that you have a vaginal infection. They should see their health care provider and be treated if they have problems, such as a mild rash or itching.  Practice safe sex by using condoms and only having one sex partner. SEEK MEDICAL CARE IF:   Your symptoms are not improving after 3 days of treatment.  You have increased discharge or pain.  You have a fever. MAKE SURE YOU:   Understand these instructions.  Will watch your condition.  Will get help right away if you are not doing well or get worse. FOR MORE INFORMATION  Centers for Disease Control and Prevention, Division of STD Prevention: www.cdc.gov/std American Sexual Health Association (ASHA): www.ashastd.org  Document Released: 07/12/2005 Document Revised: 05/02/2013 Document Reviewed: 02/21/2013 Exit[employer] Patient Information 2015 [employer]. This information is not intended to replace advice given to you by your health care provider. Make sure you discuss any questions you have with your health care provider.  

## 2015-01-01 NOTE — Addendum Note (Signed)
Addended by: Thamas Jaegers on: 01/01/2015 12:19 PM   Modules accepted: Orders

## 2015-01-04 LAB — URINE CULTURE

## 2015-02-14 ENCOUNTER — Ambulatory Visit (INDEPENDENT_AMBULATORY_CARE_PROVIDER_SITE_OTHER): Payer: BLUE CROSS/BLUE SHIELD | Admitting: Women's Health

## 2015-02-14 ENCOUNTER — Encounter: Payer: Self-pay | Admitting: Women's Health

## 2015-02-14 VITALS — BP 124/80 | Ht 66.0 in | Wt 184.0 lb

## 2015-02-14 DIAGNOSIS — R0602 Shortness of breath: Secondary | ICD-10-CM

## 2015-02-14 NOTE — Progress Notes (Signed)
Patient ID: Kendra Griffin, female   DOB: 10/16/1975, 39 y.o.   MRN: [id number] Presents with complaint of shortness of breath with exercise. States is able to work 12 hour shifts with no shortness of breath. Shortness of breath mostly with physical activity of running or brisk walk especially outside. Denies any tingling around  mouth, numbness in her arms, chest pain, nausea or vertigo. No syncopal episodes. Had a tummy tuck May 2016, has had minimal physical exercise for the past 2 months and when returned to active exercise became short of breath quickly. 2014 TAH for fibroids.  Exam: Appears well, color good no difficulty breathing. Lungs clear throughout all lung fields. Heart regular rate and rhythm. Distal pulses good, legs warm and dry with no visible edema, color changes, pedal pulses present.  Shortness of breath with activity  Plan: Reviewed normality of exam and appearance. Encouraged to increase exercise gradually, avoid outside exercise when hot. Reassurance given.

## 2015-07-25 ENCOUNTER — Ambulatory Visit (INDEPENDENT_AMBULATORY_CARE_PROVIDER_SITE_OTHER): Payer: BLUE CROSS/BLUE SHIELD | Admitting: Women's Health

## 2015-07-25 ENCOUNTER — Encounter: Payer: Self-pay | Admitting: Women's Health

## 2015-07-25 VITALS — BP 132/78 | Ht 66.0 in | Wt 192.0 lb

## 2015-07-25 DIAGNOSIS — A499 Bacterial infection, unspecified: Secondary | ICD-10-CM | POA: Diagnosis not present

## 2015-07-25 DIAGNOSIS — N3000 Acute cystitis without hematuria: Secondary | ICD-10-CM | POA: Diagnosis not present

## 2015-07-25 DIAGNOSIS — R350 Frequency of micturition: Secondary | ICD-10-CM

## 2015-07-25 DIAGNOSIS — N898 Other specified noninflammatory disorders of vagina: Secondary | ICD-10-CM | POA: Diagnosis not present

## 2015-07-25 DIAGNOSIS — B009 Herpesviral infection, unspecified: Secondary | ICD-10-CM

## 2015-07-25 DIAGNOSIS — N760 Acute vaginitis: Secondary | ICD-10-CM | POA: Diagnosis not present

## 2015-07-25 DIAGNOSIS — B9689 Other specified bacterial agents as the cause of diseases classified elsewhere: Secondary | ICD-10-CM

## 2015-07-25 LAB — WET PREP FOR TRICH, YEAST, CLUE
TRICH WET PREP: NONE SEEN
WBC, Wet Prep HPF POC: NONE SEEN
YEAST WET PREP: NONE SEEN

## 2015-07-25 LAB — URINALYSIS W MICROSCOPIC + REFLEX CULTURE
Bilirubin Urine: NEGATIVE
Casts: NONE SEEN [LPF]
Crystals: NONE SEEN [HPF]
Glucose, UA: NEGATIVE
Ketones, ur: NEGATIVE
NITRITE: NEGATIVE
Specific Gravity, Urine: 1.02 (ref 1.001–1.035)
Yeast: NONE SEEN [HPF]
pH: 7 (ref 5.0–8.0)

## 2015-07-25 MED ORDER — VALACYCLOVIR HCL 500 MG PO TABS: ORAL_TABLET | ORAL | Status: DC

## 2015-07-25 MED ORDER — METRONIDAZOLE 500 MG PO TABS: 500.0000 mg | ORAL_TABLET | Freq: Two times a day (BID) | ORAL | Status: DC

## 2015-07-25 MED ORDER — METRONIDAZOLE 0.75 % VA GEL: VAGINAL | Status: DC

## 2015-07-25 MED ORDER — CIPROFLOXACIN HCL 250 MG PO TABS: 250.0000 mg | ORAL_TABLET | Freq: Two times a day (BID) | ORAL | Status: DC

## 2015-07-25 NOTE — Progress Notes (Signed)
Patient ID: Jenne Pane, female   DOB: Mar 09, 1976, 39 y.o.   MRN: [id number] Presents with complaint of increased vaginal discharge with odor for several days. History of recurrent BV. No relief with boric acid gelcaps. Also states increased pressure with urination with change in odor of urine for 1 week. Denies burning with urination, abdominal pain or fever.  Same partner/ TAH.  Exam: Appears well. External genitalia mild erythema at introitus, speculum exam moderate amount of white discharge with odor noted, wet prep positive for many clues, TNTC bacteria. UA: Trace blood, +1 leukocytes, 40-60 WBCs, 3-10 RBCs, many bacteria, 20-60 squamous epithelials  Bacteria vaginosis UTI  Plan: Urine culture pending. Cipro 250 twice daily for 3 days. Prescription, proper use given and reviewed.  Flagyl 500 twice daily for 7 days alcohol precautions reviewed. Instructed to call if no relief of symptoms. Uses MetroGel one applicator monthly to help prevent recurrence refill given.

## 2015-07-25 NOTE — Patient Instructions (Signed)
Urinary Tract Infection Urinary tract infections (UTIs) can develop anywhere along your urinary tract. Your urinary tract is your body's drainage system for removing wastes and extra water. Your urinary tract includes two kidneys, two ureters, a bladder, and a urethra. Your kidneys are a pair of bean-shaped organs. Each kidney is about the size of your fist. They are located below your ribs, one on each side of your spine. CAUSES Infections are caused by microbes, which are microscopic organisms, including fungi, viruses, and bacteria. These organisms are so small that they can only be seen through a microscope. Bacteria are the microbes that most commonly cause UTIs. SYMPTOMS  Symptoms of UTIs may vary by age and gender of the patient and by the location of the infection. Symptoms in [place] women typically include a frequent and intense urge to urinate and a painful, burning feeling in the bladder or urethra during urination. Older women and men are more likely to be tired, shaky, and weak and have muscle aches and abdominal pain. A fever may mean the infection is in your kidneys. Other symptoms of a kidney infection include pain in your back or sides below the ribs, nausea, and vomiting. DIAGNOSIS To diagnose a UTI, your caregiver will ask you about your symptoms. Your caregiver will also ask you to provide a urine sample. The urine sample will be tested for bacteria and white blood cells. White blood cells are made by your body to help fight infection. TREATMENT  Typically, UTIs can be treated with medication. Because most UTIs are caused by a bacterial infection, they usually can be treated with the use of antibiotics. The choice of antibiotic and length of treatment depend on your symptoms and the type of bacteria causing your infection. HOME CARE INSTRUCTIONS  If you were prescribed antibiotics, take them exactly as your caregiver instructs you. Finish the medication even if you feel better after  you have only taken some of the medication.  Drink enough water and fluids to keep your urine clear or pale yellow.  Avoid caffeine, tea, and carbonated beverages. They tend to irritate your bladder.  Empty your bladder often. Avoid holding urine for long periods of time.  Empty your bladder before and after sexual intercourse.  After a bowel movement, women should cleanse from front to back. Use each tissue only once. SEEK MEDICAL CARE IF:   You have back pain.  You develop a fever.  Your symptoms do not begin to resolve within 3 days. SEEK IMMEDIATE MEDICAL CARE IF:   You have severe back pain or lower abdominal pain.  You develop chills.  You have nausea or vomiting.  You have continued burning or discomfort with urination. MAKE SURE YOU:   Understand these instructions.  Will watch your condition.  Will get help right away if you are not doing well or get worse.   This information is not intended to replace advice given to you by your health care provider. Make sure you discuss any questions you have with your health care provider.   Document Released: 04/21/2005 Document Revised: 04/02/2015 Document Reviewed: 08/20/2011 Elsevier Interactive Patient Education 2016 [employer] Bacterial Vaginosis Bacterial vaginosis is a vaginal infection that occurs when the normal balance of bacteria in the vagina is disrupted. It results from an overgrowth of certain bacteria. This is the most common vaginal infection in women of childbearing age. Treatment is important to prevent complications, especially in pregnant women, as it can cause a premature delivery. CAUSES  Bacterial  vaginosis is caused by an increase in harmful bacteria that are normally present in smaller amounts in the vagina. Several different kinds of bacteria can cause bacterial vaginosis. However, the reason that the condition develops is not fully understood. RISK FACTORS Certain activities or behaviors can  put you at an increased risk of developing bacterial vaginosis, including:  Having a new sex partner or multiple sex partners.  Douching.  Using an intrauterine device (IUD) for contraception. Women do not get bacterial vaginosis from toilet seats, bedding, swimming pools, or contact with objects around them. SIGNS AND SYMPTOMS  Some women with bacterial vaginosis have no signs or symptoms. Common symptoms include:  Grey vaginal discharge.  A fishlike odor with discharge, especially after sexual intercourse.  Itching or burning of the vagina and vulva.  Burning or pain with urination. DIAGNOSIS  Your health care provider will take a medical history and examine the vagina for signs of bacterial vaginosis. A sample of vaginal fluid may be taken. Your health care provider will look at this sample under a microscope to check for bacteria and abnormal cells. A vaginal pH test may also be done.  TREATMENT  Bacterial vaginosis may be treated with antibiotic medicines. These may be given in the form of a pill or a vaginal cream. A second round of antibiotics may be prescribed if the condition comes back after treatment. Because bacterial vaginosis increases your risk for sexually transmitted diseases, getting treated can help reduce your risk for chlamydia, gonorrhea, HIV, and herpes. HOME CARE INSTRUCTIONS   Only take over-the-counter or prescription medicines as directed by your health care provider.  If antibiotic medicine was prescribed, take it as directed. Make sure you finish it even if you start to feel better.  Tell all sexual partners that you have a vaginal infection. They should see their health care provider and be treated if they have problems, such as a mild rash or itching.  During treatment, it is important that you follow these instructions:  Avoid sexual activity or use condoms correctly.  Do not douche.  Avoid alcohol as directed by your health care provider.  Avoid  breastfeeding as directed by your health care provider. SEEK MEDICAL CARE IF:   Your symptoms are not improving after 3 days of treatment.  You have increased discharge or pain.  You have a fever. MAKE SURE YOU:   Understand these instructions.  Will watch your condition.  Will get help right away if you are not doing well or get worse. FOR MORE INFORMATION  Centers for Disease Control and Prevention, Division of STD Prevention: [employer] Association (ASHA): www.ashastd.org    This information is not intended to replace advice given to you by your health care provider. Make sure you discuss any questions you have with your health care provider.   Document Released: 07/12/2005 Document Revised: 08/02/2014 Document Reviewed: 02/21/2013 Elsevier Interactive Patient Education [employer].

## 2015-07-27 LAB — URINE CULTURE

## 2015-12-15 ENCOUNTER — Ambulatory Visit (INDEPENDENT_AMBULATORY_CARE_PROVIDER_SITE_OTHER): Payer: BLUE CROSS/BLUE SHIELD | Admitting: Gynecology

## 2015-12-15 ENCOUNTER — Encounter: Payer: Self-pay | Admitting: Gynecology

## 2015-12-15 VITALS — BP 122/80

## 2015-12-15 DIAGNOSIS — N760 Acute vaginitis: Secondary | ICD-10-CM

## 2015-12-15 DIAGNOSIS — A499 Bacterial infection, unspecified: Secondary | ICD-10-CM | POA: Diagnosis not present

## 2015-12-15 DIAGNOSIS — N390 Urinary tract infection, site not specified: Secondary | ICD-10-CM | POA: Diagnosis not present

## 2015-12-15 DIAGNOSIS — R350 Frequency of micturition: Secondary | ICD-10-CM | POA: Diagnosis not present

## 2015-12-15 DIAGNOSIS — N898 Other specified noninflammatory disorders of vagina: Secondary | ICD-10-CM

## 2015-12-15 DIAGNOSIS — Z113 Encounter for screening for infections with a predominantly sexual mode of transmission: Secondary | ICD-10-CM

## 2015-12-15 DIAGNOSIS — B9689 Other specified bacterial agents as the cause of diseases classified elsewhere: Secondary | ICD-10-CM

## 2015-12-15 LAB — URINALYSIS W MICROSCOPIC + REFLEX CULTURE
Bilirubin Urine: NEGATIVE
Casts: NONE SEEN [LPF]
Crystals: NONE SEEN [HPF]
GLUCOSE, UA: NEGATIVE
Ketones, ur: NEGATIVE
Nitrite: NEGATIVE
SPECIFIC GRAVITY, URINE: 1.015 (ref 1.001–1.035)
Yeast: NONE SEEN [HPF]
pH: 6.5 (ref 5.0–8.0)

## 2015-12-15 LAB — WET PREP FOR TRICH, YEAST, CLUE
Trich, Wet Prep: NONE SEEN
WBC, Wet Prep HPF POC: NONE SEEN
Yeast Wet Prep HPF POC: NONE SEEN

## 2015-12-15 MED ORDER — SULFAMETHOXAZOLE-TRIMETHOPRIM 800-160 MG PO TABS: 1.0000 | ORAL_TABLET | Freq: Two times a day (BID) | ORAL | Status: DC

## 2015-12-15 MED ORDER — FLUCONAZOLE 150 MG PO TABS: 150.0000 mg | ORAL_TABLET | Freq: Once | ORAL | Status: DC

## 2015-12-15 MED ORDER — CEFUROXIME AXETIL 250 MG PO TABS: 250.0000 mg | ORAL_TABLET | Freq: Two times a day (BID) | ORAL | Status: DC

## 2015-12-15 NOTE — Progress Notes (Signed)
   HPI: Patient is a 40 year old who presented the office stating she's been having some low back right flank area discomfort along with frequency and dysuria. No fever, chills, nausea, or vomiting. Patient with prior hysterectomy. Patient was having slight discharge. Patient would like to have a GC and Chlamydia culture today. Patient with no change in appetite.  ROS: A ROS was performed and pertinent positives and negatives are included in the history.  GENERAL: No fevers or chills. HEENT: No change in vision, no earache, sore throat or sinus congestion. NECK: No pain or stiffness. CARDIOVASCULAR: No chest pain or pressure. No palpitations. PULMONARY: No shortness of breath, cough or wheeze. GASTROINTESTINAL: No abdominal pain, nausea, vomiting or diarrhea, melena or bright red blood per rectum. GENITOURINARY: frequency, dysuria. MUSCULOSKELETAL: No joint or muscle pain, no back pain, no recent trauma. DERMATOLOGIC: No rash, no itching, no lesions. ENDOCRINE: No polyuria, polydipsia, no heat or cold intolerance. No recent change in weight. HEMATOLOGICAL: No anemia or easy bruising or bleeding. NEUROLOGIC: No headache, seizures, numbness, tingling or weakness. PSYCHIATRIC: No depression, no loss of interest in normal activity or change in sleep pattern.   PE:Gen. Appearance well-developed well nourished female with above-mentioned complaining Back some slight right CVA tenderness Abdomen: Soft nontender no rebound or guarding Pelvic: No gross lesions on inspection no large discharge noted vaginal cuff intact Bimanual exam no palpable mass or tenderness Rectal exam not done  Urinalysis: Many bacteria, 3-10 RBC, greater than 60 WBC  Wet prep many clue cells, too numerous to count bacteria  Assessment/plan:clinical evidence of urinary tract infection and some bacteria in the vagina possibly BV. Patient will be treated with Ceftin 250 mg one by mouth twice a day for 7 days. Patient states that she  develop she's to infection after taking antibiotics and for this reason a prescription for Diflucan 150 mg one by mouth was provided as well. A GC and Chlamydia cultures obtained at time of this dictation. She was encouraged to increase her fluid intake.     Greater than 50% of time was spent in counseling and coordinating care of this patient.   Time of consultation: 15   Minutes.

## 2015-12-15 NOTE — Patient Instructions (Addendum)
Fluconazole tablets What is this medicine? FLUCONAZOLE (floo KON na zole) is an antifungal medicine. It is used to treat certain kinds of fungal or yeast infections. This medicine may be used for other purposes; ask your health care provider or pharmacist if you have questions. What should I tell my health care provider before I take this medicine? They need to know if you have any of these conditions: -electrolyte abnormalities -history of irregular heart beat -kidney disease -an unusual or allergic reaction to fluconazole, other azole antifungals, medicines, foods, dyes, or preservatives -pregnant or trying to get pregnant -breast-feeding How should I use this medicine? Take this medicine by mouth. Follow the directions on the prescription label. Do not take your medicine more often than directed. Talk to your pediatrician regarding the use of this medicine in children. Special care may be needed. This medicine has been used in children as young as 58 months of age. Overdosage: If you think you have taken too much of this medicine contact a poison control center or emergency room at once. NOTE: This medicine is only for you. Do not share this medicine with others. What if I miss a dose? If you miss a dose, take it as soon as you can. If it is almost time for your next dose, take only that dose. Do not take double or extra doses. What may interact with this medicine? Do not take this medicine with any of the following medications: -astemizole -certain medicines for irregular heart beat like dofetilide, dronedarone, quinidine -cisapride -erythromycin -lomitapide -other medicines that prolong the QT interval (cause an abnormal heart rhythm) -pimozide -terfenadine -thioridazine -tolvaptan -ziprasidone This medicine may also interact with the following medications: -antiviral medicines for HIV or AIDS -birth control pills -certain antibiotics like rifabutin, rifampin -certain  medicines for blood pressure like amlodipine, isradipine, felodipine, hydrochlorothiazide, losartan, nifedipine -certain medicines for cancer like cyclophosphamide, vinblastine, vincristine -certain medicines for cholesterol like atorvastatin, lovastatin, fluvastatin, simvastatin -certain medicines for depression, anxiety, or psychotic disturbances like amitriptyline, midazolam, nortriptyline, triazolam -certain medicines for diabetes like glipizide, glyburide, tolbutamide -certain medicines for pain like alfentanil, fentanyl, methadone -certain medicines for seizures like carbamazepine, phenytoin -certain medicines that treat or prevent blood clots like warfarin -halofantrine -medicines that lower your chance of fighting infection like cyclosporine, prednisone, tacrolimus -NSAIDS, medicines for pain and inflammation, like celecoxib, diclofenac, flurbiprofen, ibuprofen, meloxicam, naproxen -other medicines for fungal infections -sirolimus -theophylline -tofacitinib This list may not describe all possible interactions. Give your health care provider a list of all the medicines, herbs, non-prescription drugs, or dietary supplements you use. Also tell them if you smoke, drink alcohol, or use illegal drugs. Some items may interact with your medicine. What should I watch for while using this medicine? Visit your doctor or health care professional for regular checkups. If you are taking this medicine for a long time you may need blood work. Tell your doctor if your symptoms do not improve. Some fungal infections need many weeks or months of treatment to cure. Alcohol can increase possible damage to your liver. Avoid alcoholic drinks. If you have a vaginal infection, do not have sex until you have finished your treatment. You can wear a sanitary napkin. Do not use tampons. Wear freshly washed cotton, not synthetic, panties. What side effects may I notice from receiving this medicine? Side effects that  you should report to your doctor or health care professional as soon as possible: -allergic reactions like skin rash or itching, hives, swelling of the lips, mouth,  tongue, or throat -dark urine -feeling dizzy or faint -irregular heartbeat or chest pain -redness, blistering, peeling or loosening of the skin, including inside the mouth -trouble breathing -unusual bruising or bleeding -vomiting -yellowing of the eyes or skin Side effects that usually do not require medical attention (report to your doctor or health care professional if they continue or are bothersome): -changes in how food tastes -diarrhea -headache -stomach upset or nausea This list may not describe all possible side effects. Call your doctor for medical advice about side effects. You may report side effects to FDA at 1-800-FDA-1088. Where should I keep my medicine? Keep out of the reach of children. Store at room temperature below 30 degrees C (86 degrees F). Throw away any medicine after the expiration date. NOTE: This sheet is a summary. It may not cover all possible information. If you have questions about this medicine, talk to your doctor, pharmacist, or health care provider.    2016, Elsevier/Gold Standard. (2013-02-17 16:13:04) Cefuroxime tablets What is this medicine? CEFUROXIME (se fyoor OX eem) is a cephalosporin antibiotic. It is used to treat certain kinds of bacterial infections. It will not work for colds, flu, or other viral infections. This medicine may be used for other purposes; ask your health care provider or pharmacist if you have questions. What should I tell my health care provider before I take this medicine? They need to know if you have any of these conditions: -bleeding problems -bowel disease, like colitis -kidney disease -liver disease -an unusual or allergic reaction to cefuroxime, other antibiotics or medicines, foods, dyes or preservatives -pregnant or trying to get  pregnant -breast-feeding How should I use this medicine? Take this medicine by mouth with a full glass of water. Follow the directions on the prescription label. Do not crush or chew. This medicine works best if you take it with food. Take your medicine at regular intervals. Do not take your medicine more often than directed. Take all of your medicine as directed even if you think your are better. Do not skip doses or stop your medicine early. Talk to your pediatrician regarding the use of this medicine in children. Special care may be needed. While this drug may be prescribed for children as young as 25 months of age for selected conditions, precautions do apply. Overdosage: If you think you have taken too much of this medicine contact a poison control center or emergency room at once. NOTE: This medicine is only for you. Do not share this medicine with others. What if I miss a dose? If you miss a dose, take it as soon as you can. If it is almost time for your next dose, take only that dose. Do not take double or extra doses. What may interact with this medicine? This medicine may interact with the following medications: -antacids -birth control pills -certain medicines for infection like amikacin, gentamicin, tobramycin -diuretics -probenecid -warfarin This list may not describe all possible interactions. Give your health care provider a list of all the medicines, herbs, non-prescription drugs, or dietary supplements you use. Also tell them if you smoke, drink alcohol, or use illegal drugs. Some items may interact with your medicine. What should I watch for while using this medicine? Tell your doctor or health care professional if your symptoms do not improve or if you get new symptoms. Do not treat diarrhea with over the counter products. Contact your doctor if you have diarrhea that lasts more than 2 days or if it is severe  and watery. This medicine can interfere with some urine glucose  tests. If you use such tests, talk with your health care professional. If you are being treated for a sexually transmitted disease, avoid sexual contact until you have finished your treatment. Your sexual partner may also need treatment. What side effects may I notice from receiving this medicine? Side effects that you should report to your doctor or health care professional as soon as possible: -allergic reactions like skin rash, itching or hives, swelling of the face, lips, or tongue -dark urine -difficulty breathing -fever -irregular heartbeat or chest pain -redness, blistering, peeling or loosening of the skin, including inside the mouth -seizures -unusual bleeding or bruising -unusually weak or tired -white patches or sores in the mouth Side effects that usually do not require medical attention (report to your doctor or health care professional if they continue or are bothersome): -diarrhea -gas or heartburn -headache -nausea, vomiting -vaginal itching This list may not describe all possible side effects. Call your doctor for medical advice about side effects. You may report side effects to FDA at 1-800-FDA-1088. Where should I keep my medicine? Keep out of the reach of children. Store at room temperature between 15 and 30 degrees C (59 and 86 degrees F). Keep container tightly closed. Protect from moisture. Throw away any unused medicine after the expiration date. NOTE: This sheet is a summary. It may not cover all possible information. If you have questions about this medicine, talk to your doctor, pharmacist, or health care provider.    2016, Elsevier/Gold Standard. (2013-05-28 09:43:18) Bacterial Vaginosis Bacterial vaginosis is a vaginal infection that occurs when the normal balance of bacteria in the vagina is disrupted. It results from an overgrowth of certain bacteria. This is the most common vaginal infection in women of childbearing age. Treatment is important to prevent  complications, especially in pregnant women, as it can cause a premature delivery. CAUSES  Bacterial vaginosis is caused by an increase in harmful bacteria that are normally present in smaller amounts in the vagina. Several different kinds of bacteria can cause bacterial vaginosis. However, the reason that the condition develops is not fully understood. RISK FACTORS Certain activities or behaviors can put you at an increased risk of developing bacterial vaginosis, including:  Having a new sex partner or multiple sex partners.  Douching.  Using an intrauterine device (IUD) for contraception. Women do not get bacterial vaginosis from toilet seats, bedding, swimming pools, or contact with objects around them. SIGNS AND SYMPTOMS  Some women with bacterial vaginosis have no signs or symptoms. Common symptoms include:  Grey vaginal discharge.  A fishlike odor with discharge, especially after sexual intercourse.  Itching or burning of the vagina and vulva.  Burning or pain with urination. DIAGNOSIS  Your health care provider will take a medical history and examine the vagina for signs of bacterial vaginosis. A sample of vaginal fluid may be taken. Your health care provider will look at this sample under a microscope to check for bacteria and abnormal cells. A vaginal pH test may also be done.  TREATMENT  Bacterial vaginosis may be treated with antibiotic medicines. These may be given in the form of a pill or a vaginal cream. A second round of antibiotics may be prescribed if the condition comes back after treatment. Because bacterial vaginosis increases your risk for sexually transmitted diseases, getting treated can help reduce your risk for chlamydia, gonorrhea, HIV, and herpes. HOME CARE INSTRUCTIONS   Only take over-the-counter or prescription  medicines as directed by your health care provider.  If antibiotic medicine was prescribed, take it as directed. Make sure you finish it even if you  start to feel better.  Tell all sexual partners that you have a vaginal infection. They should see their health care provider and be treated if they have problems, such as a mild rash or itching.  During treatment, it is important that you follow these instructions:  Avoid sexual activity or use condoms correctly.  Do not douche.  Avoid alcohol as directed by your health care provider.  Avoid breastfeeding as directed by your health care provider. SEEK MEDICAL CARE IF:   Your symptoms are not improving after 3 days of treatment.  You have increased discharge or pain.  You have a fever. MAKE SURE YOU:   Understand these instructions.  Will watch your condition.  Will get help right away if you are not doing well or get worse. FOR MORE INFORMATION  Centers for Disease Control and Prevention, Division of STD Prevention: [employer]raiser[employer]raud[employer] American Sexual Health Association (ASHA): www.ashastd.org    This information is not intended to replace advice given to you by your health care provider. Make sure you discuss any questions you have with your health care provider.   Document Released: 07/12/2005 Document Revised: 08/02/2014 Document Reviewed: 02/21/2013 Elsevier Interactive Patient Education 2016 Elsevier Inc. Urinary Tract Infection Urinary tract infections (UTIs) can develop anywhere along your urinary tract. Your urinary tract is your body's drainage system for removing wastes and extra water. Your urinary tract includes two kidneys, two ureters, a bladder, and a urethra. Your kidneys are a pair of bean-shaped organs. Each kidney is about the size of your fist. They are located below your ribs, one on each side of your spine. CAUSES Infections are caused by microbes, which are microscopic organisms, including fungi, viruses, and bacteria. These organisms are so small that they can only be seen through a microscope. Bacteria are the microbes that most commonly cause  UTIs. SYMPTOMS  Symptoms of UTIs may vary by age and gender of the patient and by the location of the infection. Symptoms in young women typically include a frequent and intense urge to urinate and a painful, burning feeling in the bladder or urethra during urination. Older women and men are more likely to be tired, shaky, and weak and have muscle aches and abdominal pain. A fever may mean the infection is in your kidneys. Other symptoms of a kidney infection include pain in your back or sides below the ribs, nausea, and vomiting. DIAGNOSIS To diagnose a UTI, your caregiver will ask you about your symptoms. Your caregiver will also ask you to provide a urine sample. The urine sample will be tested for bacteria and white blood cells. White blood cells are made by your body to help fight infection. TREATMENT  Typically, UTIs can be treated with medication. Because most UTIs are caused by a bacterial infection, they usually can be treated with the use of antibiotics. The choice of antibiotic and length of treatment depend on your symptoms and the type of bacteria causing your infection. HOME CARE INSTRUCTIONS  If you were prescribed antibiotics, take them exactly as your caregiver instructs you. Finish the medication even if you feel better after you have only taken some of the medication.  Drink enough water and fluids to keep your urine clear or pale yellow.  Avoid caffeine, tea, and carbonated beverages. They tend to irritate your bladder.  Empty your bladder often.  Avoid holding urine for long periods of time.  Empty your bladder before and after sexual intercourse.  After a bowel movement, women should cleanse from front to back. Use each tissue only once. SEEK MEDICAL CARE IF:   You have back pain.  You develop a fever.  Your symptoms do not begin to resolve within 3 days. SEEK IMMEDIATE MEDICAL CARE IF:   You have severe back pain or lower abdominal pain.  You develop  chills.  You have nausea or vomiting.  You have continued burning or discomfort with urination. MAKE SURE YOU:   Understand these instructions.  Will watch your condition.  Will get help right away if you are not doing well or get worse.   This information is not intended to replace advice given to you by your health care provider. Make sure you discuss any questions you have with your health care provider.   Document Released: 04/21/2005 Document Revised: 04/02/2015 Document Reviewed: 08/20/2011 Elsevier Interactive Patient Education [employer].

## 2015-12-15 NOTE — Addendum Note (Signed)
Addended by: Thurnell Garbe A on: 12/15/2015 03:41 PM   Modules accepted: Orders

## 2015-12-16 LAB — GC/CHLAMYDIA PROBE AMP
CT PROBE, AMP APTIMA: NOT DETECTED
GC Probe RNA: NOT DETECTED

## 2015-12-17 LAB — URINE CULTURE

## 2015-12-23 ENCOUNTER — Telehealth: Payer: Self-pay | Admitting: *Deleted

## 2015-12-23 NOTE — Telephone Encounter (Signed)
(   you are back up md) Pt was prescribed Ceftin 250 mg one by mouth twice a day for 7 days and septra DS BID x 7 days on OV 12/15/15. Pt only took 3 days of Rx, she left out of town and forgot her Rx at home. Pt is not having any symptoms now, she asked if she should finish Rx's? Please advise

## 2015-12-23 NOTE — Telephone Encounter (Signed)
Called [facility]inet and left message on cell phone telling her 3 days worth medication fine if sx have resolved no need for more meds. Just watch for now.

## 2015-12-23 NOTE — Telephone Encounter (Signed)
3 days probably would be enough. If her symptoms are better then I would just wator now.

## 2016-01-15 ENCOUNTER — Encounter: Payer: BLUE CROSS/BLUE SHIELD | Admitting: Women's Health

## 2016-02-05 ENCOUNTER — Ambulatory Visit (INDEPENDENT_AMBULATORY_CARE_PROVIDER_SITE_OTHER): Payer: BLUE CROSS/BLUE SHIELD | Admitting: Women's Health

## 2016-02-05 ENCOUNTER — Encounter: Payer: Self-pay | Admitting: Women's Health

## 2016-02-05 VITALS — BP 130/80 | Ht 66.0 in | Wt 201.0 lb

## 2016-02-05 DIAGNOSIS — Z01419 Encounter for gynecological examination (general) (routine) without abnormal findings: Secondary | ICD-10-CM

## 2016-02-05 DIAGNOSIS — A499 Bacterial infection, unspecified: Secondary | ICD-10-CM | POA: Diagnosis not present

## 2016-02-05 DIAGNOSIS — B009 Herpesviral infection, unspecified: Secondary | ICD-10-CM

## 2016-02-05 DIAGNOSIS — B9689 Other specified bacterial agents as the cause of diseases classified elsewhere: Secondary | ICD-10-CM

## 2016-02-05 DIAGNOSIS — N760 Acute vaginitis: Secondary | ICD-10-CM

## 2016-02-05 DIAGNOSIS — E038 Other specified hypothyroidism: Secondary | ICD-10-CM | POA: Diagnosis not present

## 2016-02-05 DIAGNOSIS — Z13220 Encounter for screening for lipoid disorders: Secondary | ICD-10-CM

## 2016-02-05 LAB — CBC WITH DIFFERENTIAL/PLATELET
BASOS PCT: 0 %
Basophils Absolute: 0 cells/uL (ref 0–200)
EOS PCT: 2 %
Eosinophils Absolute: 78 cells/uL (ref 15–500)
HEMATOCRIT: 36.6 % (ref 35.0–45.0)
HEMOGLOBIN: 12.5 g/dL (ref 11.7–15.5)
LYMPHS ABS: 1521 {cells}/uL (ref 850–3900)
Lymphocytes Relative: 39 %
MCH: 29.9 pg (ref 27.0–33.0)
MCHC: 34.2 g/dL (ref 32.0–36.0)
MCV: 87.6 fL (ref 80.0–100.0)
MONO ABS: 273 {cells}/uL (ref 200–950)
MPV: 9.5 fL (ref 7.5–12.5)
Monocytes Relative: 7 %
NEUTROS ABS: 2028 {cells}/uL (ref 1500–7800)
Neutrophils Relative %: 52 %
Platelets: 174 10*3/uL (ref 140–400)
RBC: 4.18 MIL/uL (ref 3.80–5.10)
RDW: 14.1 % (ref 11.0–15.0)
WBC: 3.9 10*3/uL (ref 3.8–10.8)

## 2016-02-05 LAB — T3 UPTAKE: T3 Uptake: 27 % (ref 22–35)

## 2016-02-05 LAB — T4: T4 TOTAL: 10 ug/dL (ref 4.5–12.0)

## 2016-02-05 LAB — LIPID PANEL
Cholesterol: 200 mg/dL (ref 125–200)
HDL: 52 mg/dL (ref >=46)
LDL CALC: 132 mg/dL — AB (ref <130)
TRIGLYCERIDES: 81 mg/dL (ref <150)
Total CHOL/HDL Ratio: 3.8 Ratio (ref <=5.0)
VLDL: 16 mg/dL (ref <30)

## 2016-02-05 LAB — COMPREHENSIVE METABOLIC PANEL
ALBUMIN: 3.9 g/dL (ref 3.6–5.1)
ALK PHOS: 51 U/L (ref 33–115)
ALT: 22 U/L (ref 6–29)
AST: 18 U/L (ref 10–30)
BILIRUBIN TOTAL: 0.3 mg/dL (ref 0.2–1.2)
BUN: 11 mg/dL (ref 7–25)
CO2: 24 mmol/L (ref 20–31)
CREATININE: 0.94 mg/dL (ref 0.50–1.10)
Calcium: 8.6 mg/dL (ref 8.6–10.2)
Chloride: 106 mmol/L (ref 98–110)
Glucose, Bld: 77 mg/dL (ref 65–99)
Potassium: 4 mmol/L (ref 3.5–5.3)
SODIUM: 139 mmol/L (ref 135–146)
TOTAL PROTEIN: 6.3 g/dL (ref 6.1–8.1)

## 2016-02-05 LAB — WET PREP FOR TRICH, YEAST, CLUE
Trich, Wet Prep: NONE SEEN
WBC WET PREP: NONE SEEN
YEAST WET PREP: NONE SEEN

## 2016-02-05 LAB — TSH: TSH: 1.11 m[IU]/L

## 2016-02-05 MED ORDER — METRONIDAZOLE 0.75 % VA GEL: VAGINAL | Status: DC

## 2016-02-05 MED ORDER — VALACYCLOVIR HCL 500 MG PO TABS: ORAL_TABLET | ORAL | Status: DC

## 2016-02-05 MED ORDER — METRONIDAZOLE 500 MG PO TABS: 500.0000 mg | ORAL_TABLET | Freq: Two times a day (BID) | ORAL | Status: DC

## 2016-02-05 NOTE — Progress Notes (Signed)
Kendra Griffin 03-02-1976 [id number]    History:    Presents for annual exam.  2014 TAH for fibroids and menorrhagia. History of myomectomy. Normal Pap history. 2016 abdominoplasty. Hypothyroid on Synthroid 100, iodine treatment 2013. HSV 2 rare outbreaks. History of recurrent BV same partner with negative STD screen. Has tried boric acid gelcaps in the past with poor relief.  Past medical history, past surgical history, family history and social history were all reviewed and documented in the EPIC chart. [name] 13, [name] 7 both doing well. Recently engaged no date set for marriage.  ROS:  A ROS was performed and pertinent positives and negatives are included.  Exam:  Filed Vitals:   02/05/16 1103  BP: 130/80    General appearance:  Normal Thyroid:  Symmetrical, normal in size, without palpable masses or nodularity. Respiratory  Auscultation:  Clear without wheezing or rhonchi Cardiovascular  Auscultation:  Regular rate, without rubs, murmurs or gallops  Edema/varicosities:  Not grossly evident Abdominal  Soft,nontender, without masses, guarding or rebound.  Liver/spleen:  No organomegaly noted  Hernia:  None appreciated  Skin  Inspection:  Grossly normal   Breasts: Examined lying and sitting.     Right: Without masses, retractions, discharge or axillary adenopathy.     Left: Without masses, retractions, discharge or axillary adenopathy. Gentitourinary   Inguinal/mons:  Normal without inguinal adenopathy  External genitalia:  Normal  BUS/Urethra/Skene's glands:  Normal  Vagina:  Adherent white discharge with odor, wet prep positive for many clues, TNTC bacteria  Cervix:  And uterus absent  Adnexa/parametria:     Rt: Without masses or tenderness.   Lt: Without masses or tenderness.  Anus and perineum: Normal  Digital rectal exam: Normal sphincter tone without palpated masses or tenderness  Assessment/Plan:  40 y.o. Engaged BF G2P2 for annual examWith complaint of  questionable thyroid problem.  Recurrent BV TAH for fibroids HSV 2-rare outbreaks Hypothyroid-Dr. Chalmers Cater  Plan: Options reviewed, Flagyl 500 twice daily for 7 days, alcohol precautions reviewed. MetroGel vaginal cream 1 application weekly when necessary, instructed to call if no relief. SBE's, annual screening mammogram at 40 encouraged. Encouraged to increase regular exercise and decrease calories for weight loss, gained 12 pounds in the past year. Vitamin D 1000 daily encouraged. Valtrex 500 twice daily 3-5 days when necessary prescription, proper use given and reviewed. CBC, CMP, lipid panel, thyroid panel, UA. Keep scheduled follow-up with Dr. Chalmers Cater for annual  in December.   Huel Cote [facility], 12:23 PM 02/05/2016

## 2016-02-05 NOTE — Addendum Note (Signed)
Addended by: Burnett Kanaris on: 02/05/2016 02:05 PM   Modules accepted: Orders, SmartSet

## 2016-02-05 NOTE — Patient Instructions (Signed)
Bacterial Vaginosis Bacterial vaginosis is a vaginal infection that occurs when the normal balance of bacteria in the vagina is disrupted. It results from an overgrowth of certain bacteria. This is the most common vaginal infection in women of childbearing age. Treatment is important to prevent complications, especially in pregnant women, as it can cause a premature delivery. CAUSES  Bacterial vaginosis is caused by an increase in harmful bacteria that are normally present in smaller amounts in the vagina. Several different kinds of bacteria can cause bacterial vaginosis. However, the reason that the condition develops is not fully understood. RISK FACTORS Certain activities or behaviors can put you at an increased risk of developing bacterial vaginosis, including:  Having a new sex partner or multiple sex partners.  Douching.  Using an intrauterine device (IUD) for contraception. Women do not get bacterial vaginosis from toilet seats, bedding, swimming pools, or contact with objects around them. SIGNS AND SYMPTOMS  Some women with bacterial vaginosis have no signs or symptoms. Common symptoms include:  Grey vaginal discharge.  A fishlike odor with discharge, especially after sexual intercourse.  Itching or burning of the vagina and vulva.  Burning or pain with urination. DIAGNOSIS  Your health care provider will take a medical history and examine the vagina for signs of bacterial vaginosis. A sample of vaginal fluid may be taken. Your health care provider will look at this sample under a microscope to check for bacteria and abnormal cells. A vaginal pH test may also be done.  TREATMENT  Bacterial vaginosis may be treated with antibiotic medicines. These may be given in the form of a pill or a vaginal cream. A second round of antibiotics may be prescribed if the condition comes back after treatment. Because bacterial vaginosis increases your risk for sexually transmitted diseases, getting  treated can help reduce your risk for chlamydia, gonorrhea, HIV, and herpes. HOME CARE INSTRUCTIONS   Only take over-the-counter or prescription medicines as directed by your health care provider.  If antibiotic medicine was prescribed, take it as directed. Make sure you finish it even if you start to feel better.  Tell all sexual partners that you have a vaginal infection. They should see their health care provider and be treated if they have problems, such as a mild rash or itching.  During treatment, it is important that you follow these instructions:  Avoid sexual activity or use condoms correctly.  Do not douche.  Avoid alcohol as directed by your health care provider.  Avoid breastfeeding as directed by your health care provider. SEEK MEDICAL CARE IF:   Your symptoms are not improving after 3 days of treatment.  You have increased discharge or pain.  You have a fever. MAKE SURE YOU:   Understand these instructions.  Will watch your condition.  Will get help right away if you are not doing well or get worse. FOR MORE INFORMATION  Centers for Disease Control and Prevention, Division of STD Prevention: [employer] American Sexual Health Association (ASHA): www.ashastd.org    This information is not intended to replace advice given to you by your health care provider. Make sure you discuss any questions you have with your health care provider.   Document Released: 07/12/2005 Document Revised: 08/02/2014 Document Reviewed: 02/21/2013 Elsevier Interactive Patient Education 2016 [place] Maintenance, Female Adopting a healthy lifestyle and getting preventive care can go a long way to promote health and wellness. Talk with your health care provider about what schedule of regular examinations is right for you.  This is a good chance for you to check in with your provider about disease prevention and staying healthy. In between checkups, there are plenty of  things you can do on your own. Experts have done a lot of research about which lifestyle changes and preventive measures are most likely to keep you healthy. Ask your health care provider for more information. WEIGHT AND DIET  Eat a healthy diet  Be sure to include plenty of vegetables, fruits, low-fat dairy products, and lean protein.  Do not eat a lot of foods high in solid fats, added sugars, or salt.  Get regular exercise. This is one of the most important things you can do for your health.  Most adults should exercise for at least 150 minutes each week. The exercise should increase your heart rate and make you sweat (moderate-intensity exercise).  Most adults should also do strengthening exercises at least twice a week. This is in addition to the moderate-intensity exercise.  Maintain a healthy weight  Body mass index (BMI) is a measurement that can be used to identify possible weight problems. It estimates body fat based on height and weight. Your health care provider can help determine your BMI and help you achieve or maintain a healthy weight.  For females 58 years of age and older:   A BMI below 18.5 is considered underweight.  A BMI of 18.5 to 24.9 is normal.  A BMI of 25 to 29.9 is considered overweight.  A BMI of 30 and above is considered obese.  Watch levels of cholesterol and blood lipids  You should start having your blood tested for lipids and cholesterol at 39 years of age, then have this test every 5 years.  You may need to have your cholesterol levels checked more often if:  Your lipid or cholesterol levels are high.  You are older than 39 years of age.  You are at high risk for heart disease.  CANCER SCREENING   Lung Cancer  Lung cancer screening is recommended for adults 62-77 years old who are at high risk for lung cancer because of a history of smoking.  A yearly low-dose CT scan of the lungs is recommended for people who:  Currently  smoke.  Have quit within the past 15 years.  Have at least a 30-pack-year history of smoking. A pack year is smoking an average of one pack of cigarettes a day for 1 year.  Yearly screening should continue until it has been 15 years since you quit.  Yearly screening should stop if you develop a health problem that would prevent you from having lung cancer treatment.  Breast Cancer  Practice breast self-awareness. This means understanding how your breasts normally appear and feel.  It also means doing regular breast self-exams. Let your health care provider know about any changes, no matter how small.  If you are in your 20s or 30s, you should have a clinical breast exam (CBE) by a health care provider every 1-3 years as part of a regular health exam.  If you are 33 or older, have a CBE every year. Also consider having a breast X-ray (mammogram) every year.  If you have a family history of breast cancer, talk to your health care provider about genetic screening.  If you are at high risk for breast cancer, talk to your health care provider about having an MRI and a mammogram every year.  Breast cancer gene (BRCA) assessment is recommended for women who have family members  with BRCA-related cancers. BRCA-related cancers include:  Breast.  Ovarian.  Tubal.  Peritoneal cancers.  Results of the assessment will determine the need for genetic counseling and BRCA1 and BRCA2 testing. Cervical Cancer Your health care provider may recommend that you be screened regularly for cancer of the pelvic organs (ovaries, uterus, and vagina). This screening involves a pelvic examination, including checking for microscopic changes to the surface of your cervix (Pap test). You may be encouraged to have this screening done every 3 years, beginning at age 13.  For women ages 57-65, health care providers may recommend pelvic exams and Pap testing every 3 years, or they may recommend the Pap and pelvic  exam, combined with testing for human papilloma virus (HPV), every 5 years. Some types of HPV increase your risk of cervical cancer. Testing for HPV may also be done on women of any age with unclear Pap test results.  Other health care providers may not recommend any screening for nonpregnant women who are considered low risk for pelvic cancer and who do not have symptoms. Ask your health care provider if a screening pelvic exam is right for you.  If you have had past treatment for cervical cancer or a condition that could lead to cancer, you need Pap tests and screening for cancer for at least 20 years after your treatment. If Pap tests have been discontinued, your risk factors (such as having a new sexual partner) need to be reassessed to determine if screening should resume. Some women have medical problems that increase the chance of getting cervical cancer. In these cases, your health care provider may recommend more frequent screening and Pap tests. Colorectal Cancer  This type of cancer can be detected and often prevented.  Routine colorectal cancer screening usually begins at 39 years of age and continues through 39 years of age.  Your health care provider may recommend screening at an earlier age if you have risk factors for colon cancer.  Your health care provider may also recommend using home test kits to check for hidden blood in the stool.  A small camera at the end of a tube can be used to examine your colon directly (sigmoidoscopy or colonoscopy). This is done to check for the earliest forms of colorectal cancer.  Routine screening usually begins at age 71.  Direct examination of the colon should be repeated every 5-10 years through 39 years of age. However, you may need to be screened more often if early forms of precancerous polyps or small growths are found. Skin Cancer  Check your skin from head to toe regularly.  Tell your health care provider about any new moles or  changes in moles, especially if there is a change in a mole's shape or color.  Also tell your health care provider if you have a mole that is larger than the size of a pencil eraser.  Always use sunscreen. Apply sunscreen liberally and repeatedly throughout the day.  Protect yourself by wearing long sleeves, pants, a wide-brimmed hat, and sunglasses whenever you are outside. HEART DISEASE, DIABETES, AND HIGH BLOOD PRESSURE   High blood pressure causes heart disease and increases the risk of stroke. High blood pressure is more likely to develop in:  People who have blood pressure in the high end of the normal range (130-139/85-89 mm Hg).  People who are overweight or obese.  People who are African American.  If you are 53-74 years of age, have your blood pressure checked every 3-5 years.  If you are 32 years of age or older, have your blood pressure checked every year. You should have your blood pressure measured twice--once when you are at a hospital or clinic, and once when you are not at a hospital or clinic. Record the average of the two measurements. To check your blood pressure when you are not at a hospital or clinic, you can use:  An automated blood pressure machine at a pharmacy.  A home blood pressure monitor.  If you are between 74 years and 8 years old, ask your health care provider if you should take aspirin to prevent strokes.  Have regular diabetes screenings. This involves taking a blood sample to check your fasting blood sugar level.  If you are at a normal weight and have a low risk for diabetes, have this test once every three years after 39 years of age.  If you are overweight and have a high risk for diabetes, consider being tested at a younger age or more often. PREVENTING INFECTION  Hepatitis B  If you have a higher risk for hepatitis B, you should be screened for this virus. You are considered at high risk for hepatitis B if:  You were born in a country where  hepatitis B is common. Ask your health care provider which countries are considered high risk.  Your parents were born in a high-risk country, and you have not been immunized against hepatitis B (hepatitis B vaccine).  You have HIV or AIDS.  You use needles to inject street drugs.  You live with someone who has hepatitis B.  You have had sex with someone who has hepatitis B.  You get hemodialysis treatment.  You take certain medicines for conditions, including cancer, organ transplantation, and autoimmune conditions. Hepatitis C  Blood testing is recommended for:  Everyone born from [birth year] through 1964.  Anyone with known risk factors for hepatitis C. Sexually transmitted infections (STIs)  You should be screened for sexually transmitted infections (STIs) including gonorrhea and chlamydia if:  You are sexually active and are younger than 39 years of age.  You are older than 39 years of age and your health care provider tells you that you are at risk for this type of infection.  Your sexual activity has changed since you were last screened and you are at an increased risk for chlamydia or gonorrhea. Ask your health care provider if you are at risk.  If you do not have HIV, but are at risk, it may be recommended that you take a prescription medicine daily to prevent HIV infection. This is called pre-exposure prophylaxis (PrEP). You are considered at risk if:  You are sexually active and do not regularly use condoms or know the HIV status of your partner(s).  You take drugs by injection.  You are sexually active with a partner who has HIV. Talk with your health care provider about whether you are at high risk of being infected with HIV. If you choose to begin PrEP, you should first be tested for HIV. You should then be tested every 3 months for as long as you are taking PrEP.  PREGNANCY   If you are premenopausal and you may become pregnant, ask your health care provider about  preconception counseling.  If you may become pregnant, take 400 to 800 micrograms (mcg) of folic acid every day.  If you want to prevent pregnancy, talk to your health care provider about birth control (contraception). OSTEOPOROSIS AND MENOPAUSE   Osteoporosis  is a disease in which the bones lose minerals and strength with aging. This can result in serious bone fractures. Your risk for osteoporosis can be identified using a bone density scan.  If you are 55 years of age or older, or if you are at risk for osteoporosis and fractures, ask your health care provider if you should be screened.  Ask your health care provider whether you should take a calcium or vitamin D supplement to lower your risk for osteoporosis.  Menopause may have certain physical symptoms and risks.  Hormone replacement therapy may reduce some of these symptoms and risks. Talk to your health care provider about whether hormone replacement therapy is right for you.  HOME CARE INSTRUCTIONS   Schedule regular health, dental, and eye exams.  Stay current with your immunizations.   Do not use any tobacco products including cigarettes, chewing tobacco, or electronic cigarettes.  If you are pregnant, do not drink alcohol.  If you are breastfeeding, limit how much and how often you drink alcohol.  Limit alcohol intake to no more than 1 drink per day for nonpregnant women. One drink equals 12 ounces of beer, 5 ounces of wine, or 1 ounces of hard liquor.  Do not use street drugs.  Do not share needles.  Ask your health care provider for help if you need support or information about quitting drugs.  Tell your health care provider if you often feel depressed.  Tell your health care provider if you have ever been abused or do not feel safe at home.   This information is not intended to replace advice given to you by your health care provider. Make sure you discuss any questions you have with your health care  provider.   Document Released: 01/25/2011 Document Revised: 08/02/2014 Document Reviewed: 06/13/2013 Elsevier Interactive Patient Education [employer].

## 2016-02-06 LAB — URINALYSIS W MICROSCOPIC + REFLEX CULTURE
Bacteria, UA: NONE SEEN [HPF]
Bilirubin Urine: NEGATIVE
CASTS: NONE SEEN [LPF]
Crystals: NONE SEEN [HPF]
Glucose, UA: NEGATIVE
Hgb urine dipstick: NEGATIVE
Ketones, ur: NEGATIVE
Leukocytes, UA: NEGATIVE
NITRITE: NEGATIVE
PH: 6 (ref 5.0–8.0)
Protein, ur: NEGATIVE
RBC / HPF: NONE SEEN RBC/HPF (ref <=2)
SPECIFIC GRAVITY, URINE: 1.022 (ref 1.001–1.035)
WBC, UA: NONE SEEN WBC/HPF (ref <=5)
YEAST: NONE SEEN [HPF]

## 2016-02-06 LAB — THYROID ANTIBODIES: Thyroperoxidase Ab SerPl-aCnc: 7 IU/mL (ref <9)

## 2016-12-08 ENCOUNTER — Encounter: Payer: Self-pay | Admitting: Gynecology

## 2017-02-14 ENCOUNTER — Ambulatory Visit (INDEPENDENT_AMBULATORY_CARE_PROVIDER_SITE_OTHER): Payer: BLUE CROSS/BLUE SHIELD | Admitting: Women's Health

## 2017-02-14 ENCOUNTER — Encounter: Payer: Self-pay | Admitting: Women's Health

## 2017-02-14 VITALS — BP 124/78 | Ht 66.0 in | Wt 206.0 lb

## 2017-02-14 DIAGNOSIS — E038 Other specified hypothyroidism: Secondary | ICD-10-CM

## 2017-02-14 DIAGNOSIS — Z01419 Encounter for gynecological examination (general) (routine) without abnormal findings: Secondary | ICD-10-CM | POA: Diagnosis not present

## 2017-02-14 DIAGNOSIS — B009 Herpesviral infection, unspecified: Secondary | ICD-10-CM

## 2017-02-14 MED ORDER — LEVOTHYROXINE SODIUM 100 MCG PO TABS: 100.0000 ug | ORAL_TABLET | Freq: Every day | ORAL | 4 refills | Status: DC

## 2017-02-14 MED ORDER — VALACYCLOVIR HCL 500 MG PO TABS: ORAL_TABLET | ORAL | 4 refills | Status: DC

## 2017-02-14 MED ORDER — IBUPROFEN 600 MG PO TABS: 600.0000 mg | ORAL_TABLET | Freq: Three times a day (TID) | ORAL | 1 refills | Status: DC | PRN

## 2017-02-14 NOTE — Addendum Note (Signed)
Addended by: Huel Cote on: 02/14/2017 04:59 PM   Modules accepted: Orders

## 2017-02-14 NOTE — Patient Instructions (Addendum)
Mammogram  [phone]  Breast center  Carbohydrate Counting for Diabetes Mellitus, Adult Carbohydrate counting is a method for keeping track of how many carbohydrates you eat. Eating carbohydrates naturally increases the amount of sugar (glucose) in the blood. Counting how many carbohydrates you eat helps keep your blood glucose within normal limits, which helps you manage your diabetes (diabetes mellitus). It is important to know how many carbohydrates you can safely have in each meal. This is different for every person. A diet and nutrition specialist (registered dietitian) can help you make a meal plan and calculate how many carbohydrates you should have at each meal and snack. Carbohydrates are found in the following foods:  Grains, such as breads and cereals.  Dried beans and soy products.  Starchy vegetables, such as potatoes, peas, and corn.  Fruit and fruit juices.  Milk and yogurt.  Sweets and snack foods, such as cake, cookies, candy, chips, and soft drinks.  How do I count carbohydrates? There are two ways to count carbohydrates in food. You can use either of the methods or a combination of both. Reading "Nutrition Facts" on packaged food The "Nutrition Facts" list is included on the labels of almost all packaged foods and beverages in the [place] It includes:  The serving size.  Information about nutrients in each serving, including the grams (g) of carbohydrate per serving.  To use the "Nutrition Facts":  Decide how many servings you will have.  Multiply the number of servings by the number of carbohydrates per serving.  The resulting number is the total amount of carbohydrates that you will be having.  Learning standard serving sizes of other foods When you eat foods containing carbohydrates that are not packaged or do not include "Nutrition Facts" on the label, you need to measure the servings in order to count the amount of carbohydrates:  Measure the foods that you  will eat with a food scale or measuring cup, if needed.  Decide how many standard-size servings you will eat.  Multiply the number of servings by 15. Most carbohydrate-rich foods have about 15 g of carbohydrates per serving. ? For example, if you eat 8 oz (170 g) of strawberries, you will have eaten 2 servings and 30 g of carbohydrates (2 servings x 15 g = 30 g).  For foods that have more than one food mixed, such as soups and casseroles, you must count the carbohydrates in each food that is included.  The following list contains standard serving sizes of common carbohydrate-rich foods. Each of these servings has about 15 g of carbohydrates:   hamburger bun or  English muffin.   oz (15 mL) syrup.   oz (14 g) jelly.  1 slice of bread.  1 six-inch tortilla.  3 oz (85 g) cooked rice or pasta.  4 oz (113 g) cooked dried beans.  4 oz (113 g) starchy vegetable, such as peas, corn, or potatoes.  4 oz (113 g) hot cereal.  4 oz (113 g) mashed potatoes or  of a large baked potato.  4 oz (113 g) canned or frozen fruit.  4 oz (120 mL) fruit juice.  4-6 crackers.  6 chicken nuggets.  6 oz (170 g) unsweetened dry cereal.  6 oz (170 g) plain fat-free yogurt or yogurt sweetened with artificial sweeteners.  8 oz (240 mL) milk.  8 oz (170 g) fresh fruit or one small piece of fruit.  24 oz (680 g) popped popcorn.  Example of carbohydrate counting Sample meal  3 oz (85 g) chicken breast.  6 oz (170 g) brown rice.  4 oz (113 g) corn.  8 oz (240 mL) milk.  8 oz (170 g) strawberries with sugar-free whipped topping. Carbohydrate calculation 1. Identify the foods that contain carbohydrates: ? Rice. ? Corn. ? Milk. ? Strawberries. 2. Calculate how many servings you have of each food: ? 2 servings rice. ? 1 serving corn. ? 1 serving milk. ? 1 serving strawberries. 3. Multiply each number of servings by 15 g: ? 2 servings rice x 15 g = 30 g. ? 1 serving corn x 15 g  = 15 g. ? 1 serving milk x 15 g = 15 g. ? 1 serving strawberries x 15 g = 15 g. 4. Add together all of the amounts to find the total grams of carbohydrates eaten: ? 30 g + 15 g + 15 g + 15 g = 75 g of carbohydrates total. This information is not intended to replace advice given to you by your health care provider. Make sure you discuss any questions you have with your health care provider. Document Released: 07/12/2005 Document Revised: 01/30/2016 Document Reviewed: 12/24/2015 Elsevier Interactive Patient Education  2018 [place] Maintenance, Female Adopting a healthy lifestyle and getting preventive care can go a long way to promote health and wellness. Talk with your health care provider about what schedule of regular examinations is right for you. This is a good chance for you to check in with your provider about disease prevention and staying healthy. In between checkups, there are plenty of things you can do on your own. Experts have done a lot of research about which lifestyle changes and preventive measures are most likely to keep you healthy. Ask your health care provider for more information. Weight and diet Eat a healthy diet  Be sure to include plenty of vegetables, fruits, low-fat dairy products, and lean protein.  Do not eat a lot of foods high in solid fats, added sugars, or salt.  Get regular exercise. This is one of the most important things you can do for your health. ? Most adults should exercise for at least 150 minutes each week. The exercise should increase your heart rate and make you sweat (moderate-intensity exercise). ? Most adults should also do strengthening exercises at least twice a week. This is in addition to the moderate-intensity exercise.  Maintain a healthy weight  Body mass index (BMI) is a measurement that can be used to identify possible weight problems. It estimates body fat based on height and weight. Your health care provider can help  determine your BMI and help you achieve or maintain a healthy weight.  For females 48 years of age and older: ? A BMI below 18.5 is considered underweight. ? A BMI of 18.5 to 24.9 is normal. ? A BMI of 25 to 29.9 is considered overweight. ? A BMI of 30 and above is considered obese.  Watch levels of cholesterol and blood lipids  You should start having your blood tested for lipids and cholesterol at 40 years of age, then have this test every 5 years.  You may need to have your cholesterol levels checked more often if: ? Your lipid or cholesterol levels are high. ? You are older than 40 years of age. ? You are at high risk for heart disease.  Cancer screening Lung Cancer  Lung cancer screening is recommended for adults 61-36 years old who are at high risk for lung cancer because  of a history of smoking.  A yearly low-dose CT scan of the lungs is recommended for people who: ? Currently smoke. ? Have quit within the past 15 years. ? Have at least a 30-pack-year history of smoking. A pack year is smoking an average of one pack of cigarettes a day for 1 year.  Yearly screening should continue until it has been 15 years since you quit.  Yearly screening should stop if you develop a health problem that would prevent you from having lung cancer treatment.  Breast Cancer  Practice breast self-awareness. This means understanding how your breasts normally appear and feel.  It also means doing regular breast self-exams. Let your health care provider know about any changes, no matter how small.  If you are in your 20s or 30s, you should have a clinical breast exam (CBE) by a health care provider every 1-3 years as part of a regular health exam.  If you are 71 or older, have a CBE every year. Also consider having a breast X-ray (mammogram) every year.  If you have a family history of breast cancer, talk to your health care provider about genetic screening.  If you are at high risk for  breast cancer, talk to your health care provider about having an MRI and a mammogram every year.  Breast cancer gene (BRCA) assessment is recommended for women who have family members with BRCA-related cancers. BRCA-related cancers include: ? Breast. ? Ovarian. ? Tubal. ? Peritoneal cancers.  Results of the assessment will determine the need for genetic counseling and BRCA1 and BRCA2 testing.  Cervical Cancer Your health care provider may recommend that you be screened regularly for cancer of the pelvic organs (ovaries, uterus, and vagina). This screening involves a pelvic examination, including checking for microscopic changes to the surface of your cervix (Pap test). You may be encouraged to have this screening done every 3 years, beginning at age 33.  For women ages 59-65, health care providers may recommend pelvic exams and Pap testing every 3 years, or they may recommend the Pap and pelvic exam, combined with testing for human papilloma virus (HPV), every 5 years. Some types of HPV increase your risk of cervical cancer. Testing for HPV may also be done on women of any age with unclear Pap test results.  Other health care providers may not recommend any screening for nonpregnant women who are considered low risk for pelvic cancer and who do not have symptoms. Ask your health care provider if a screening pelvic exam is right for you.  If you have had past treatment for cervical cancer or a condition that could lead to cancer, you need Pap tests and screening for cancer for at least 20 years after your treatment. If Pap tests have been discontinued, your risk factors (such as having a new sexual partner) need to be reassessed to determine if screening should resume. Some women have medical problems that increase the chance of getting cervical cancer. In these cases, your health care provider may recommend more frequent screening and Pap tests.  Colorectal Cancer  This type of cancer can be  detected and often prevented.  Routine colorectal cancer screening usually begins at 40 years of age and continues through 40 years of age.  Your health care provider may recommend screening at an earlier age if you have risk factors for colon cancer.  Your health care provider may also recommend using home test kits to check for hidden blood in the stool.  A small camera at the end of a tube can be used to examine your colon directly (sigmoidoscopy or colonoscopy). This is done to check for the earliest forms of colorectal cancer.  Routine screening usually begins at age 71.  Direct examination of the colon should be repeated every 5-10 years through 40 years of age. However, you may need to be screened more often if early forms of precancerous polyps or small growths are found.  Skin Cancer  Check your skin from head to toe regularly.  Tell your health care provider about any new moles or changes in moles, especially if there is a change in a mole's shape or color.  Also tell your health care provider if you have a mole that is larger than the size of a pencil eraser.  Always use sunscreen. Apply sunscreen liberally and repeatedly throughout the day.  Protect yourself by wearing long sleeves, pants, a wide-brimmed hat, and sunglasses whenever you are outside.  Heart disease, diabetes, and high blood pressure  High blood pressure causes heart disease and increases the risk of stroke. High blood pressure is more likely to develop in: ? People who have blood pressure in the high end of the normal range (130-139/85-89 mm Hg). ? People who are overweight or obese. ? People who are African American.  If you are 31-83 years of age, have your blood pressure checked every 3-5 years. If you are 32 years of age or older, have your blood pressure checked every year. You should have your blood pressure measured twice-once when you are at a hospital or clinic, and once when you are not at a  hospital or clinic. Record the average of the two measurements. To check your blood pressure when you are not at a hospital or clinic, you can use: ? An automated blood pressure machine at a pharmacy. ? A home blood pressure monitor.  If you are between 14 years and 57 years old, ask your health care provider if you should take aspirin to prevent strokes.  Have regular diabetes screenings. This involves taking a blood sample to check your fasting blood sugar level. ? If you are at a normal weight and have a low risk for diabetes, have this test once every three years after 41 years of age. ? If you are overweight and have a high risk for diabetes, consider being tested at a younger age or more often. Preventing infection Hepatitis B  If you have a higher risk for hepatitis B, you should be screened for this virus. You are considered at high risk for hepatitis B if: ? You were born in a country where hepatitis B is common. Ask your health care provider which countries are considered high risk. ? Your parents were born in a high-risk country, and you have not been immunized against hepatitis B (hepatitis B vaccine). ? You have HIV or AIDS. ? You use needles to inject street drugs. ? You live with someone who has hepatitis B. ? You have had sex with someone who has hepatitis B. ? You get hemodialysis treatment. ? You take certain medicines for conditions, including cancer, organ transplantation, and autoimmune conditions.  Hepatitis C  Blood testing is recommended for: ? Everyone born from [birth year] through 1964. ? Anyone with known risk factors for hepatitis C.  Sexually transmitted infections (STIs)  You should be screened for sexually transmitted infections (STIs) including gonorrhea and chlamydia if: ? You are sexually active and are younger than 40 years  of age. ? You are older than 40 years of age and your health care provider tells you that you are at risk for this type of  infection. ? Your sexual activity has changed since you were last screened and you are at an increased risk for chlamydia or gonorrhea. Ask your health care provider if you are at risk.  If you do not have HIV, but are at risk, it may be recommended that you take a prescription medicine daily to prevent HIV infection. This is called pre-exposure prophylaxis (PrEP). You are considered at risk if: ? You are sexually active and do not regularly use condoms or know the HIV status of your partner(s). ? You take drugs by injection. ? You are sexually active with a partner who has HIV.  Talk with your health care provider about whether you are at high risk of being infected with HIV. If you choose to begin PrEP, you should first be tested for HIV. You should then be tested every 3 months for as long as you are taking PrEP. Pregnancy  If you are premenopausal and you may become pregnant, ask your health care provider about preconception counseling.  If you may become pregnant, take 400 to 800 micrograms (mcg) of folic acid every day.  If you want to prevent pregnancy, talk to your health care provider about birth control (contraception). Osteoporosis and menopause  Osteoporosis is a disease in which the bones lose minerals and strength with aging. This can result in serious bone fractures. Your risk for osteoporosis can be identified using a bone density scan.  If you are 53 years of age or older, or if you are at risk for osteoporosis and fractures, ask your health care provider if you should be screened.  Ask your health care provider whether you should take a calcium or vitamin D supplement to lower your risk for osteoporosis.  Menopause may have certain physical symptoms and risks.  Hormone replacement therapy may reduce some of these symptoms and risks. Talk to your health care provider about whether hormone replacement therapy is right for you. Follow these instructions at home:  Schedule  regular health, dental, and eye exams.  Stay current with your immunizations.  Do not use any tobacco products including cigarettes, chewing tobacco, or electronic cigarettes.  If you are pregnant, do not drink alcohol.  If you are breastfeeding, limit how much and how often you drink alcohol.  Limit alcohol intake to no more than 1 drink per day for nonpregnant women. One drink equals 12 ounces of beer, 5 ounces of wine, or 1 ounces of hard liquor.  Do not use street drugs.  Do not share needles.  Ask your health care provider for help if you need support or information about quitting drugs.  Tell your health care provider if you often feel depressed.  Tell your health care provider if you have ever been abused or do not feel safe at home. This information is not intended to replace advice given to you by your health care provider. Make sure you discuss any questions you have with your health care provider. Document Released: 01/25/2011 Document Revised: 12/18/2015 Document Reviewed: 04/15/2015 Elsevier Interactive Patient Education  [employer].

## 2017-02-14 NOTE — Progress Notes (Addendum)
EMMY KENG 1975/10/04 [id number]    History:    Presents for annual exam.  2014 TVH for fibroids . Same partner with negative STD screen. 2013 iodine treatment for hyperthyroidism now on Synthroid 100 g. HSV 2 rare outbreaks. Normal Pap history. Has not had a screening mammogram. Struggling with her weight, had been on phentermine per primary care had blood pressure elevation and stopped.  Past medical history, past surgical history, family history and social history were all reviewed and documented in the EPIC chart. Engaged, fianc has a 72-year-old daughter 50% custody. Ashley's will be 13, Amber 8 both doing well, [name] had a tough year with being bullied.  ROS:  A ROS was performed and pertinent positives and negatives are included.  Exam:  Vitals:   02/14/17 1610  BP: 124/78  Weight: 206 lb (93.4 kg)  Height: 5\' 6"  (1.676 m)   Body mass index is 33.25 kg/m.   General appearance:  Normal Thyroid:  Symmetrical, normal in size, without palpable masses or nodularity. Respiratory  Auscultation:  Clear without wheezing or rhonchi Cardiovascular  Auscultation:  Regular rate, without rubs, murmurs or gallops  Edema/varicosities:  Not grossly evident Abdominal 2016 abdominoplasty  Soft,nontender, without masses, guarding or rebound.  Liver/spleen:  No organomegaly noted  Hernia:  None appreciated  Skin  Inspection:  Grossly normal   Breasts: Examined lying and sitting.     Right: Without masses, retractions, discharge or axillary adenopathy.     Left: Without masses, retractions, discharge or axillary adenopathy. Gentitourinary   Inguinal/mons:  Normal without inguinal adenopathy  External genitalia:  Normal  BUS/Urethra/Skene's glands:  Normal  Vagina:  Normal  Cervix:  And uterus absent  Adnexa/parametria:     Rt: Without masses or tenderness.   Lt: Without masses or tenderness.  Anus and perineum: Normal  Digital rectal exam: Normal sphincter tone without  palpated masses or tenderness  Assessment/Plan:  41 y.o. engaged BF G3 P2 for annual exam with complaint of difficulty losing weight and tension headaches.  2014 TVH for fibroids Hypothyroid HSV 2-for outbreaks Obesity  Plan: Weight Watchers, low carb diet, increase exercise reviewed. SBE's, annual screening mammogram breast center information given instructed to schedule. Vitamin D 1000 daily encouraged. Valtrex 500 twice daily for 3-5 days when necessary prescription, proper use given and reviewed. Synthroid 100 g by mouth daily prescription, proper use given and reviewed. Motrin 600 mg every 8 hours when necessary for headaches prescription, proper use given and reviewed. CBC, CMP, TSH, instructed to come fasting next year will check a lipid panel.    [place], 4:52 PM 02/14/2017

## 2017-02-15 LAB — CBC WITH DIFFERENTIAL/PLATELET
BASOS ABS: 0 {cells}/uL (ref 0–200)
Basophils Relative: 0 %
EOS PCT: 1 %
Eosinophils Absolute: 52 cells/uL (ref 15–500)
HCT: 38.1 % (ref 35.0–45.0)
HEMOGLOBIN: 12.8 g/dL (ref 11.7–15.5)
Lymphocytes Relative: 40 %
Lymphs Abs: 2080 cells/uL (ref 850–3900)
MCH: 30.2 pg (ref 27.0–33.0)
MCHC: 33.6 g/dL (ref 32.0–36.0)
MCV: 89.9 fL (ref 80.0–100.0)
MONOS PCT: 5 %
MPV: 10 fL (ref 7.5–12.5)
Monocytes Absolute: 260 cells/uL (ref 200–950)
NEUTROS PCT: 54 %
Neutro Abs: 2808 cells/uL (ref 1500–7800)
Platelets: 197 10*3/uL (ref 140–400)
RBC: 4.24 MIL/uL (ref 3.80–5.10)
RDW: 14.2 % (ref 11.0–15.0)
WBC: 5.2 10*3/uL (ref 3.8–10.8)

## 2017-02-15 LAB — COMPREHENSIVE METABOLIC PANEL
ALK PHOS: 48 U/L (ref 33–115)
ALT: 14 U/L (ref 6–29)
AST: 18 U/L (ref 10–30)
Albumin: 4.3 g/dL (ref 3.6–5.1)
BUN: 10 mg/dL (ref 7–25)
CO2: 26 mmol/L (ref 20–31)
CREATININE: 0.86 mg/dL (ref 0.50–1.10)
Calcium: 9.3 mg/dL (ref 8.6–10.2)
Chloride: 102 mmol/L (ref 98–110)
Glucose, Bld: 78 mg/dL (ref 65–99)
Potassium: 3.9 mmol/L (ref 3.5–5.3)
SODIUM: 137 mmol/L (ref 135–146)
TOTAL PROTEIN: 7 g/dL (ref 6.1–8.1)
Total Bilirubin: 0.4 mg/dL (ref 0.2–1.2)

## 2017-02-15 LAB — TSH: TSH: 1.67 m[IU]/L

## 2018-02-15 ENCOUNTER — Ambulatory Visit (INDEPENDENT_AMBULATORY_CARE_PROVIDER_SITE_OTHER): Payer: BLUE CROSS/BLUE SHIELD | Admitting: Women's Health

## 2018-02-15 ENCOUNTER — Encounter: Payer: Self-pay | Admitting: Women's Health

## 2018-02-15 VITALS — BP 126/80 | Ht 66.0 in | Wt 206.0 lb

## 2018-02-15 DIAGNOSIS — B009 Herpesviral infection, unspecified: Secondary | ICD-10-CM | POA: Diagnosis not present

## 2018-02-15 DIAGNOSIS — Z01419 Encounter for gynecological examination (general) (routine) without abnormal findings: Secondary | ICD-10-CM

## 2018-02-15 DIAGNOSIS — E038 Other specified hypothyroidism: Secondary | ICD-10-CM | POA: Diagnosis not present

## 2018-02-15 LAB — COMPREHENSIVE METABOLIC PANEL
AG Ratio: 1.7 (calc) (ref 1.0–2.5)
ALBUMIN MSPROF: 4.3 g/dL (ref 3.6–5.1)
ALKALINE PHOSPHATASE (APISO): 57 U/L (ref 33–115)
ALT: 12 U/L (ref 6–29)
AST: 16 U/L (ref 10–30)
BUN: 10 mg/dL (ref 7–25)
CO2: 30 mmol/L (ref 20–32)
Calcium: 9.2 mg/dL (ref 8.6–10.2)
Chloride: 104 mmol/L (ref 98–110)
Creat: 0.86 mg/dL (ref 0.50–1.10)
GLOBULIN: 2.5 g/dL (ref 1.9–3.7)
Glucose, Bld: 92 mg/dL (ref 65–99)
Potassium: 3.7 mmol/L (ref 3.5–5.3)
Sodium: 138 mmol/L (ref 135–146)
Total Bilirubin: 0.3 mg/dL (ref 0.2–1.2)
Total Protein: 6.8 g/dL (ref 6.1–8.1)

## 2018-02-15 LAB — CBC WITH DIFFERENTIAL/PLATELET
BASOS PCT: 0.8 %
Basophils Absolute: 41 cells/uL (ref 0–200)
EOS ABS: 92 {cells}/uL (ref 15–500)
Eosinophils Relative: 1.8 %
HCT: 35.7 % (ref 35.0–45.0)
Hemoglobin: 12.5 g/dL (ref 11.7–15.5)
Lymphs Abs: 1652 cells/uL (ref 850–3900)
MCH: 29.8 pg (ref 27.0–33.0)
MCHC: 35 g/dL (ref 32.0–36.0)
MCV: 85 fL (ref 80.0–100.0)
MPV: 10.2 fL (ref 7.5–12.5)
Monocytes Relative: 5.3 %
Neutro Abs: 3045 cells/uL (ref 1500–7800)
Neutrophils Relative %: 59.7 %
Platelets: 214 10*3/uL (ref 140–400)
RBC: 4.2 10*6/uL (ref 3.80–5.10)
RDW: 13.3 % (ref 11.0–15.0)
Total Lymphocyte: 32.4 %
WBC: 5.1 10*3/uL (ref 3.8–10.8)
WBCMIX: 270 {cells}/uL (ref 200–950)

## 2018-02-15 LAB — TSH: TSH: 0.86 m[IU]/L

## 2018-02-15 MED ORDER — LEVOTHYROXINE SODIUM 100 MCG PO TABS: 100.0000 ug | ORAL_TABLET | Freq: Every day | ORAL | 4 refills | Status: DC

## 2018-02-15 MED ORDER — IBUPROFEN 600 MG PO TABS: 600.0000 mg | ORAL_TABLET | Freq: Three times a day (TID) | ORAL | 1 refills | Status: DC | PRN

## 2018-02-15 MED ORDER — VALACYCLOVIR HCL 500 MG PO TABS: ORAL_TABLET | ORAL | 4 refills | Status: DC

## 2018-02-15 NOTE — Progress Notes (Signed)
Kendra Griffin 06/17/1976 [id number]    History:    Presents for annual exam.  2014 TVH for fibroids on no HRT.  2013 iodine therapy for hyperthyroidism.  Currently on 100 mcg of Synthroid.  Has not had a screening mammogram.  Normal Pap history.  HSV-2 rare outbreaks.  Engaged, same partner for years.  Past medical history, past surgical history, family history and social history were all reviewed and documented in the EPIC chart.  Daughter is Kendra Griffin 75 and Kendra Griffin 9 both doing well, their father giving no child support and minimal visits.  ROS:  A ROS was performed and pertinent positives and negatives are included.  Exam:  Vitals:   02/15/18 1559  BP: 126/80  Weight: 206 lb (93.4 kg)  Height: 5\' 6"  (1.676 m)   Body mass index is 33.25 kg/m.   General appearance:  Normal Thyroid:  Symmetrical, normal in size, without palpable masses or nodularity. Respiratory  Auscultation:  Clear without wheezing or rhonchi Cardiovascular  Auscultation:  Regular rate, without rubs, murmurs or gallops  Edema/varicosities:  Not grossly evident Abdominal tummy tuck  Soft,nontender, without masses, guarding or rebound.  Liver/spleen:  No organomegaly noted  Hernia:  None appreciated  Skin  Inspection:  Grossly normal   Breasts: Examined lying and sitting.     Right: Without masses, retractions, discharge or axillary adenopathy.     Left: Without masses, retractions, discharge or axillary adenopathy. Gentitourinary   Inguinal/mons:  Normal without inguinal adenopathy  External genitalia:  Normal  BUS/Urethra/Skene's glands:  Normal  Vagina:  Normal  Cervix: And uterus absent  Adnexa/parametria:     Rt: Without masses or tenderness.   Lt: Without masses or tenderness.  Anus and perineum: Normal  Digital rectal exam: Normal sphincter tone without palpated masses or tenderness  Assessment/Plan:  42 y.o. engaged BF G3, P2 for annual exam with no complaints.  2014 TVH for  fibroids Hypothyroidism HSV-2 rare outbreaks Obesity  Plan: Valtrex 500 twice daily for 3 to 5 days as needed, prescription given.  Synthroid 100 mcg p.o. daily prescription, proper use given and reviewed.  Aware of proper administration.  SBE's, reviewed importance of annual screening mammogram, breast center information given, instructed to schedule.  Encouraged to increase exercise and decrease calorie/carbs for weight loss.  CBC, TSH, CMP,    Huel Cote [facility], 5:04 PM 02/15/2018

## 2018-02-15 NOTE — Patient Instructions (Addendum)
[place] [id number]  Health Maintenance, Female Adopting a healthy lifestyle and getting preventive care can go a long way to promote health and wellness. Talk with your health care provider about what schedule of regular examinations is right for you. This is a good chance for you to check in with your provider about disease prevention and staying healthy. In between checkups, there are plenty of things you can do on your own. Experts have done a lot of research about which lifestyle changes and preventive measures are most likely to keep you healthy. Ask your health care provider for more information. Weight and diet Eat a healthy diet  Be sure to include plenty of vegetables, fruits, low-fat dairy products, and lean protein.  Do not eat a lot of foods high in solid fats, added sugars, or salt.  Get regular exercise. This is one of the most important things you can do for your health. ? Most adults should exercise for at least 150 minutes each week. The exercise should increase your heart rate and make you sweat (moderate-intensity exercise). ? Most adults should also do strengthening exercises at least twice a week. This is in addition to the moderate-intensity exercise.  Maintain a healthy weight  Body mass index (BMI) is a measurement that can be used to identify possible weight problems. It estimates body fat based on height and weight. Your health care provider can help determine your BMI and help you achieve or maintain a healthy weight.  For females 56 years of age and older: ? A BMI below 18.5 is considered underweight. ? A BMI of 18.5 to 24.9 is normal. ? A BMI of 25 to 29.9 is considered overweight. ? A BMI of 30 and above is considered obese.  Watch levels of cholesterol and blood lipids  You should start having your blood tested for lipids and cholesterol at 41 years of age, then have this test every 5 years.  You may need to have your cholesterol levels  checked more often if: ? Your lipid or cholesterol levels are high. ? You are older than 41 years of age. ? You are at high risk for heart disease.  Cancer screening Lung Cancer  Lung cancer screening is recommended for adults 17-66 years old who are at high risk for lung cancer because of a history of smoking.  A yearly low-dose CT scan of the lungs is recommended for people who: ? Currently smoke. ? Have quit within the past 15 years. ? Have at least a 30-pack-year history of smoking. A pack year is smoking an average of one pack of cigarettes a day for 1 year.  Yearly screening should continue until it has been 15 years since you quit.  Yearly screening should stop if you develop a health problem that would prevent you from having lung cancer treatment.  Breast Cancer  Practice breast self-awareness. This means understanding how your breasts normally appear and feel.  It also means doing regular breast self-exams. Let your health care provider know about any changes, no matter how small.  If you are in your 20s or 30s, you should have a clinical breast exam (CBE) by a health care provider every 1-3 years as part of a regular health exam.  If you are 22 or older, have a CBE every year. Also consider having a breast X-ray (mammogram) every year.  If you have a family history of breast cancer, talk to your health care provider about genetic screening.  If you are at high risk for breast cancer, talk to your health care provider about having an MRI and a mammogram every year.  Breast cancer gene (BRCA) assessment is recommended for women who have family members with BRCA-related cancers. BRCA-related cancers include: ? Breast. ? Ovarian. ? Tubal. ? Peritoneal cancers.  Results of the assessment will determine the need for genetic counseling and BRCA1 and BRCA2 testing.  Cervical Cancer Your health care provider may recommend that you be screened regularly for cancer of the  pelvic organs (ovaries, uterus, and vagina). This screening involves a pelvic examination, including checking for microscopic changes to the surface of your cervix (Pap test). You may be encouraged to have this screening done every 3 years, beginning at age 35.  For women ages 17-65, health care providers may recommend pelvic exams and Pap testing every 3 years, or they may recommend the Pap and pelvic exam, combined with testing for human papilloma virus (HPV), every 5 years. Some types of HPV increase your risk of cervical cancer. Testing for HPV may also be done on women of any age with unclear Pap test results.  Other health care providers may not recommend any screening for nonpregnant women who are considered low risk for pelvic cancer and who do not have symptoms. Ask your health care provider if a screening pelvic exam is right for you.  If you have had past treatment for cervical cancer or a condition that could lead to cancer, you need Pap tests and screening for cancer for at least 20 years after your treatment. If Pap tests have been discontinued, your risk factors (such as having a new sexual partner) need to be reassessed to determine if screening should resume. Some women have medical problems that increase the chance of getting cervical cancer. In these cases, your health care provider may recommend more frequent screening and Pap tests.  Colorectal Cancer  This type of cancer can be detected and often prevented.  Routine colorectal cancer screening usually begins at 41 years of age and continues through 41 years of age.  Your health care provider may recommend screening at an earlier age if you have risk factors for colon cancer.  Your health care provider may also recommend using home test kits to check for hidden blood in the stool.  A small camera at the end of a tube can be used to examine your colon directly (sigmoidoscopy or colonoscopy). This is done to check for the  earliest forms of colorectal cancer.  Routine screening usually begins at age 40.  Direct examination of the colon should be repeated every 5-10 years through 41 years of age. However, you may need to be screened more often if early forms of precancerous polyps or small growths are found.  Skin Cancer  Check your skin from head to toe regularly.  Tell your health care provider about any new moles or changes in moles, especially if there is a change in a mole's shape or color.  Also tell your health care provider if you have a mole that is larger than the size of a pencil eraser.  Always use sunscreen. Apply sunscreen liberally and repeatedly throughout the day.  Protect yourself by wearing long sleeves, pants, a wide-brimmed hat, and sunglasses whenever you are outside.  Heart disease, diabetes, and high blood pressure  High blood pressure causes heart disease and increases the risk of stroke. High blood pressure is more likely to develop in: ? People who have blood  pressure in the high end of the normal range (130-139/85-89 mm Hg). ? People who are overweight or obese. ? People who are African American.  If you are 56-87 years of age, have your blood pressure checked every 3-5 years. If you are 74 years of age or older, have your blood pressure checked every year. You should have your blood pressure measured twice-once when you are at a hospital or clinic, and once when you are not at a hospital or clinic. Record the average of the two measurements. To check your blood pressure when you are not at a hospital or clinic, you can use: ? An automated blood pressure machine at a pharmacy. ? A home blood pressure monitor.  If you are between 69 years and 79 years old, ask your health care provider if you should take aspirin to prevent strokes.  Have regular diabetes screenings. This involves taking a blood sample to check your fasting blood sugar level. ? If you are at a normal weight and  have a low risk for diabetes, have this test once every three years after 41 years of age. ? If you are overweight and have a high risk for diabetes, consider being tested at a younger age or more often. Preventing infection Hepatitis B  If you have a higher risk for hepatitis B, you should be screened for this virus. You are considered at high risk for hepatitis B if: ? You were born in a country where hepatitis B is common. Ask your health care provider which countries are considered high risk. ? Your parents were born in a high-risk country, and you have not been immunized against hepatitis B (hepatitis B vaccine). ? You have HIV or AIDS. ? You use needles to inject street drugs. ? You live with someone who has hepatitis B. ? You have had sex with someone who has hepatitis B. ? You get hemodialysis treatment. ? You take certain medicines for conditions, including cancer, organ transplantation, and autoimmune conditions.  Hepatitis C  Blood testing is recommended for: ? Everyone born from [birth year] through 1964. ? Anyone with known risk factors for hepatitis C.  Sexually transmitted infections (STIs)  You should be screened for sexually transmitted infections (STIs) including gonorrhea and chlamydia if: ? You are sexually active and are younger than 41 years of age. ? You are older than 41 years of age and your health care provider tells you that you are at risk for this type of infection. ? Your sexual activity has changed since you were last screened and you are at an increased risk for chlamydia or gonorrhea. Ask your health care provider if you are at risk.  If you do not have HIV, but are at risk, it may be recommended that you take a prescription medicine daily to prevent HIV infection. This is called pre-exposure prophylaxis (PrEP). You are considered at risk if: ? You are sexually active and do not regularly use condoms or know the HIV status of your partner(s). ? You take drugs by  injection. ? You are sexually active with a partner who has HIV.  Talk with your health care provider about whether you are at high risk of being infected with HIV. If you choose to begin PrEP, you should first be tested for HIV. You should then be tested every 3 months for as long as you are taking PrEP. Pregnancy  If you are premenopausal and you may become pregnant, ask your health care provider about preconception  counseling.  If you may become pregnant, take 400 to 800 micrograms (mcg) of folic acid every day.  If you want to prevent pregnancy, talk to your health care provider about birth control (contraception). Osteoporosis and menopause  Osteoporosis is a disease in which the bones lose minerals and strength with aging. This can result in serious bone fractures. Your risk for osteoporosis can be identified using a bone density scan.  If you are 35 years of age or older, or if you are at risk for osteoporosis and fractures, ask your health care provider if you should be screened.  Ask your health care provider whether you should take a calcium or vitamin D supplement to lower your risk for osteoporosis.  Menopause may have certain physical symptoms and risks.  Hormone replacement therapy may reduce some of these symptoms and risks. Talk to your health care provider about whether hormone replacement therapy is right for you. Follow these instructions at home:  Schedule regular health, dental, and eye exams.  Stay current with your immunizations.  Do not use any tobacco products including cigarettes, chewing tobacco, or electronic cigarettes.  If you are pregnant, do not drink alcohol.  If you are breastfeeding, limit how much and how often you drink alcohol.  Limit alcohol intake to no more than 1 drink per day for nonpregnant women. One drink equals 12 ounces of beer, 5 ounces of wine, or 1 ounces of hard liquor.  Do not use street drugs.  Do not share needles.  Ask  your health care provider for help if you need support or information about quitting drugs.  Tell your health care provider if you often feel depressed.  Tell your health care provider if you have ever been abused or do not feel safe at home. This information is not intended to replace advice given to you by your health care provider. Make sure you discuss any questions you have with your health care provider. Document Released: 01/25/2011 Document Revised: 12/18/2015 Document Reviewed: 04/15/2015 Elsevier Interactive Patient Education  [employer].

## 2018-03-19 ENCOUNTER — Other Ambulatory Visit: Payer: Self-pay | Admitting: Women's Health

## 2018-03-19 DIAGNOSIS — E038 Other specified hypothyroidism: Secondary | ICD-10-CM

## 2018-05-10 ENCOUNTER — Telehealth: Payer: Self-pay

## 2018-05-10 NOTE — Telephone Encounter (Signed)
Patient called because x 1 mos she has been having brown circular spots on her thigh and buttocks areas.  She said when one goes away another comes.  Feels like dry spots.  Recommended office visit for [place] to examine.  Transferred to [facility] to schedule visit.

## 2018-05-16 ENCOUNTER — Encounter: Payer: Self-pay | Admitting: Women's Health

## 2018-05-16 ENCOUNTER — Ambulatory Visit (INDEPENDENT_AMBULATORY_CARE_PROVIDER_SITE_OTHER): Payer: BLUE CROSS/BLUE SHIELD | Admitting: Women's Health

## 2018-05-16 DIAGNOSIS — B9689 Other specified bacterial agents as the cause of diseases classified elsewhere: Secondary | ICD-10-CM | POA: Diagnosis not present

## 2018-05-16 DIAGNOSIS — N760 Acute vaginitis: Secondary | ICD-10-CM

## 2018-05-16 LAB — WET PREP FOR TRICH, YEAST, CLUE

## 2018-05-16 MED ORDER — METRONIDAZOLE 500 MG PO TABS: 500.0000 mg | ORAL_TABLET | Freq: Two times a day (BID) | ORAL | 0 refills | Status: DC

## 2018-05-16 MED ORDER — BETAMETHASONE VALERATE 0.1 % EX OINT: 1.0000 "application " | TOPICAL_OINTMENT | Freq: Two times a day (BID) | CUTANEOUS | 0 refills | Status: DC

## 2018-05-16 MED ORDER — IBUPROFEN 600 MG PO TABS: 600.0000 mg | ORAL_TABLET | Freq: Three times a day (TID) | ORAL | 1 refills | Status: DC | PRN

## 2018-05-16 NOTE — Progress Notes (Signed)
42 year old engaged BF G3, P2 presents with complaint of several dry round spots on lower arms and right upper thigh.  Not itchy, no change with over-the-counter Lotrimin, no other family members with them.  No change in laundry soap, personal products.  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  Having some vaginal discharge with odor.  Denies urinary symptoms, abdominal pain, back pain or fever.  Hypothyroid endocrinologist manages normal TSH in June has had no problems/changes in the last few years..  Exam: Appears well.  Several flat dry circular 2 cm areas on left lower arm mild loss of pigment, right thigh 2 to 3 cm areas round, dry with some flaking.  Abdomen soft, nontender, external genitalia within normal limits, vitiligo, minimal erythema and introitus, speculum exam scant white discharge with mild odor noted, wet prep positive for clues, TNTC bacteria.  Bimanual nontender.  Bacterial vaginosis Dry nonpruritic rash  Plan: Flagyl 500 twice daily for 7 days, alcohol precautions reviewed.  Valisone .1% twice daily to affected areas, instructed to follow-up with dermatologist if continued problems.

## 2018-05-16 NOTE — Patient Instructions (Signed)
Atopic Dermatitis Atopic dermatitis is a skin disorder that causes inflammation of the skin. This is the most common type of eczema. Eczema is a group of skin conditions that cause the skin to be itchy, red, and swollen. This condition is generally worse during the cooler winter months and often improves during the warm summer months. Symptoms can vary from person to person. Atopic dermatitis usually starts showing signs in infancy and can last through adulthood. This condition cannot be passed from one person to another (non-contagious), but it is more common in families. Atopic dermatitis may not always be present. When it is present, it is called a flare-up. What are the causes? The exact cause of this condition is not known. Flare-ups of the condition may be triggered by:  Contact with something that you are sensitive or allergic to.  Stress.  Certain foods.  Extremely hot or cold weather.  Harsh chemicals and soaps.  Dry air.  Chlorine.  What increases the risk? This condition is more likely to develop in people who have a personal history or family history of eczema, allergies, asthma, or hay fever. What are the signs or symptoms? Symptoms of this condition include:  Dry, scaly skin.  Red, itchy rash.  Itchiness, which can be severe. This may occur before the skin rash. This can make sleeping difficult.  Skin thickening and cracking that can occur over time.  How is this diagnosed? This condition is diagnosed based on your symptoms, a medical history, and a physical exam. How is this treated? There is no cure for this condition, but symptoms can usually be controlled. Treatment focuses on:  Controlling the itchiness and scratching. You may be given medicines, such as antihistamines or steroid creams.  Limiting exposure to things that you are sensitive or allergic to (allergens).  Recognizing situations that cause stress and developing a plan to manage stress.  If  your atopic dermatitis does not get better with medicines, or if it is all over your body (widespread), a treatment using a specific type of light (phototherapy) may be used. Follow these instructions at home: Skin care  Keep your skin well-moisturized. Doing this seals in moisture and helps to prevent dryness. ? Use unscented lotions that have petroleum in them. ? Avoid lotions that contain alcohol or water. They can dry the skin.  Keep baths or showers short (less than 5 minutes) in warm water. Do not use hot water. ? Use mild, unscented cleansers for bathing. Avoid soap and bubble bath. ? Apply a moisturizer to your skin right after a bath or shower.  Do not apply anything to your skin without checking with your health care provider. General instructions  Dress in clothes made of cotton or cotton blends. Dress lightly because heat increases itchiness.  When washing your clothes, rinse your clothes twice so all of the soap is removed.  Avoid any triggers that can cause a flare-up.  Try to manage your stress.  Keep your fingernails cut short.  Avoid scratching. Scratching makes the rash and itchiness worse. It may also result in a skin infection (impetigo) due to a break in the skin caused by scratching.  Take or apply over-the-counter and prescription medicines only as told by your health care provider.  Keep all follow-up visits as told by your health care provider. This is important.  Do not be around people who have cold sores or fever blisters. If you get the infection, it may cause your atopic dermatitis to worsen. Contact   a health care provider if:  Your itchiness interferes with sleep.  Your rash gets worse or it is not better within one week of starting treatment.  You have a fever.  You have a rash flare-up after having contact with someone who has cold sores or fever blisters. Get help right away if:  You develop pus or soft yellow scabs in the rash  area. Summary  This condition causes a red rash and itchy, dry, scaly skin.  Treatment focuses on controlling the itchiness and scratching, limiting exposure to things that you are sensitive or allergic to (allergens), recognizing situations that cause stress, and developing a plan to manage stress.  Keep your skin well-moisturized.  Keep baths or showers shorter than 5 minutes and use warm water. Do not use hot water. This information is not intended to replace advice given to you by your health care provider. Make sure you discuss any questions you have with your health care provider. Document Released: 07/09/2000 Document Revised: 08/13/2016 Document Reviewed: 08/13/2016 Elsevier Interactive Patient Education  2018 [employer]. Rash A rash is a change in the color of the skin. A rash can also change the way your skin feels. There are many different conditions and factors that can cause a rash. Follow these instructions at home: Pay attention to any changes in your symptoms. Follow these instructions to help with your condition: Medicine Take or apply over-the-counter and prescription medicines only as told by your health care provider. These may include:  Corticosteroid cream.  Anti-itch lotions.  Oral antihistamines.  Skin Care  Apply cool compresses to the affected areas.  Try taking a bath with: ? Epsom salts. Follow the instructions on the packaging. You can get these at your local pharmacy or grocery store. ? Baking soda. Pour a small amount into the bath as told by your health care provider. ? Colloidal oatmeal. Follow the instructions on the packaging. You can get this at your local pharmacy or grocery store.  Try applying baking soda paste to your skin. Stir water into baking soda until it reaches a paste-like consistency.  Do not scratch or rub your skin.  Avoid covering the rash. Make sure the rash is exposed to air as much as possible. General  instructions  Avoid hot showers or baths, which can make itching worse. A cold shower may help.  Avoid scented soaps, detergents, and perfumes. Use gentle soaps, detergents, perfumes, and other cosmetic products.  Avoid any substance that causes your rash. Keep a journal to help track what causes your rash. Write down: ? What you eat. ? What cosmetic products you use. ? What you drink. ? What you wear. This includes jewelry.  Keep all follow-up visits as told by your health care provider. This is important. Contact a health care provider if:  You sweat at night.  You lose weight.  You urinate more than normal.  You feel weak.  You vomit.  Your skin or the whites of your eyes look yellow (jaundice).  Your skin: ? Tingles. ? Is numb.  Your rash: ? Does not go away after several days. ? Gets worse.  You are: ? Unusually thirsty. ? More tired than normal.  You have: ? New symptoms. ? Pain in your abdomen. ? A fever. ? Diarrhea. Get help right away if:  You develop a rash that covers all or most of your body. The rash may or may not be painful.  You develop blisters that: ? Are on top  of the rash. ? Grow larger or grow together. ? Are painful. ? Are inside your nose or mouth.  You develop a rash that: ? Looks like purple pinprick-sized spots all over your body. ? Has a "bull's eye" or looks like a target. ? Is not related to sun exposure, is red and painful, and causes your skin to peel. This information is not intended to replace advice given to you by your health care provider. Make sure you discuss any questions you have with your health care provider. Document Released: 07/02/2002 Document Revised: 12/16/2015 Document Reviewed: 11/27/2014 Elsevier Interactive Patient Education  2018 [employer].

## 2018-08-01 DIAGNOSIS — E890 Postprocedural hypothyroidism: Secondary | ICD-10-CM | POA: Diagnosis not present

## 2018-08-08 DIAGNOSIS — E890 Postprocedural hypothyroidism: Secondary | ICD-10-CM | POA: Diagnosis not present

## 2019-02-20 ENCOUNTER — Encounter: Payer: BLUE CROSS/BLUE SHIELD | Admitting: Women's Health

## 2019-02-26 ENCOUNTER — Other Ambulatory Visit: Payer: Self-pay

## 2019-02-26 DIAGNOSIS — R6889 Other general symptoms and signs: Secondary | ICD-10-CM | POA: Diagnosis not present

## 2019-02-26 DIAGNOSIS — Z20822 Contact with and (suspected) exposure to covid-19: Secondary | ICD-10-CM

## 2019-02-27 ENCOUNTER — Other Ambulatory Visit: Payer: Self-pay

## 2019-02-28 ENCOUNTER — Encounter: Payer: Self-pay | Admitting: Women's Health

## 2019-02-28 ENCOUNTER — Ambulatory Visit (INDEPENDENT_AMBULATORY_CARE_PROVIDER_SITE_OTHER): Payer: BC Managed Care – PPO | Admitting: Women's Health

## 2019-02-28 VITALS — BP 126/80 | Wt 206.0 lb

## 2019-02-28 DIAGNOSIS — N898 Other specified noninflammatory disorders of vagina: Secondary | ICD-10-CM | POA: Diagnosis not present

## 2019-02-28 DIAGNOSIS — Z01419 Encounter for gynecological examination (general) (routine) without abnormal findings: Secondary | ICD-10-CM

## 2019-02-28 DIAGNOSIS — Z13220 Encounter for screening for lipoid disorders: Secondary | ICD-10-CM

## 2019-02-28 DIAGNOSIS — B009 Herpesviral infection, unspecified: Secondary | ICD-10-CM | POA: Diagnosis not present

## 2019-02-28 LAB — NOVEL CORONAVIRUS, NAA: SARS-CoV-2, NAA: NOT DETECTED

## 2019-02-28 LAB — WET PREP FOR TRICH, YEAST, CLUE

## 2019-02-28 MED ORDER — VALACYCLOVIR HCL 500 MG PO TABS: ORAL_TABLET | ORAL | 4 refills | Status: DC

## 2019-02-28 MED ORDER — METRONIDAZOLE 0.75 % VA GEL: VAGINAL | 4 refills | Status: DC

## 2019-02-28 MED ORDER — METRONIDAZOLE 500 MG PO TABS: 500.0000 mg | ORAL_TABLET | Freq: Two times a day (BID) | ORAL | 0 refills | Status: DC

## 2019-02-28 NOTE — Progress Notes (Signed)
Kendra Griffin 1976/02/18 [id number]    History:    Presents for annual exam.  2014 TVH for fibroids on no HRT.  Normal Pap history, has not had a screening mammogram.  2013 iodine treatment hypothyroid Dr. Soyla Murphy manages.  Engaged, wedding not scheduled.  HSV rare outbreaks.  History of recurrent BV does well when uses 1 applicator MetroGel monthly but has not done recently.  Had poor results with boric acid gelcaps.  Past medical history, past surgical history, family history and social history were all reviewed and documented in the EPIC chart.  Management in a factory.  [name] 16, has had Gardasil [employer] both doing well.  Father sees the girls about once a month no child support.  History of a tummy tuck.  Mother diabetes and heart disease, father deceased stroke.  ROS:  A ROS was performed and pertinent positives and negatives are included.  Exam:  Vitals:   02/28/19 1600  BP: 126/80  Weight: 206 lb (93.4 kg)   Body mass index is 33.25 kg/m.   General appearance:  Normal Thyroid:  Symmetrical, normal in size, without palpable masses or nodularity. Respiratory  Auscultation:  Clear without wheezing or rhonchi Cardiovascular  Auscultation:  Regular rate, without rubs, murmurs or gallops  Edema/varicosities:  Not grossly evident Abdominal tummy tuck well-healed incision  Soft,nontender, without masses, guarding or rebound.  Liver/spleen:  No organomegaly noted  Hernia:  None appreciated  Skin  Inspection:  Grossly normal   Breasts: Examined lying and sitting.     Right: Without masses, retractions, discharge or axillary adenopathy.     Left: Without masses, retractions, discharge or axillary adenopathy. Gentitourinary   Inguinal/mons:  Normal without inguinal adenopathy  External genitalia:  Normal  BUS/Urethra/Skene's glands:  Normal  Vagina: Moderate white discharge wet prep positive for clues, TNTC bacteria   Cervix: And uterus absent   Adnexa/parametria:      Rt: Without masses or tenderness.   Lt: Without masses or tenderness.  Anus and perineum: Normal  Digital rectal exam: Normal sphincter tone without palpated masses or tenderness  Assessment/Plan:  42 y.o. engaged BF G3, P2 for annual exam with complaint of vaginal discharge and low libido.  Recurrent bacterial vaginosis 2014 TVH for fibroid uterus Hypothyroidism Dr. Chalmers Cater manages HSV rare outbreaks  Plan: Flagyl 500 twice daily for 7 days, MetroGel vaginal cream 1 applicator monthly for prevention.  Alcohol precautions reviewed.  SBEs, reviewed importance of annual screening mammogram has not had, breast center information given instructed to schedule.  Increase regular cardio type exercise and decrease calorie/carbs.  Vitamin D 1000 daily encouraged.  Denies pain with intercourse and reports great relationship, reviewed importance of date nights, taking time for the relationship.  Counseling as needed.  Valtrex 500 twice daily for 3 to 5 days as needed, prescription, proper use given and reviewed.  CBC, CMP, lipid panel, Pap screening guidelines reviewed.    [place], 4:19 PM 02/28/2019

## 2019-02-28 NOTE — Patient Instructions (Addendum)
Mammogram [phone]  Breast center [address]  Health Maintenance, Female Adopting a healthy lifestyle and getting preventive care are important in promoting health and wellness. Ask your health care provider about:  The right schedule for you to have regular tests and exams.  Things you can do on your own to prevent diseases and keep yourself healthy. What should I know about diet, weight, and exercise? Eat a healthy diet   Eat a diet that includes plenty of vegetables, fruits, low-fat dairy products, and lean protein.  Do not eat a lot of foods that are high in solid fats, added sugars, or sodium. Maintain a healthy weight Body mass index (BMI) is used to identify weight problems. It estimates body fat based on height and weight. Your health care provider can help determine your BMI and help you achieve or maintain a healthy weight. Get regular exercise Get regular exercise. This is one of the most important things you can do for your health. Most adults should:  Exercise for at least 150 minutes each week. The exercise should increase your heart rate and make you sweat (moderate-intensity exercise).  Do strengthening exercises at least twice a week. This is in addition to the moderate-intensity exercise.  Spend less time sitting. Even light physical activity can be beneficial. Watch cholesterol and blood lipids Have your blood tested for lipids and cholesterol at 42 years of age, then have this test every 5 years. Have your cholesterol levels checked more often if:  Your lipid or cholesterol levels are high.  You are older than 42 years of age.  You are at high risk for heart disease. What should I know about cancer screening? Depending on your health history and family history, you may need to have cancer screening at various ages. This may include screening for:  Breast cancer.  Cervical cancer.  Colorectal cancer.  Skin cancer.  Lung cancer. What  should I know about heart disease, diabetes, and high blood pressure? Blood pressure and heart disease  High blood pressure causes heart disease and increases the risk of stroke. This is more likely to develop in people who have high blood pressure readings, are of African descent, or are overweight.  Have your blood pressure checked: ? Every 3-5 years if you are 42-39 years of age. ? Every year if you are 42 years old or older. Diabetes Have regular diabetes screenings. This checks your fasting blood sugar level. Have the screening done:  Once every three years after age 42 if you are at a normal weight and have a low risk for diabetes.  More often and at a younger age if you are overweight or have a high risk for diabetes. What should I know about preventing infection? Hepatitis B If you have a higher risk for hepatitis B, you should be screened for this virus. Talk with your health care provider to find out if you are at risk for hepatitis B infection. Hepatitis C Testing is recommended for:  Everyone born from [birth year] through 1965.  Anyone with known risk factors for hepatitis C. Sexually transmitted infections (STIs)  Get screened for STIs, including gonorrhea and chlamydia, if: ? You are sexually active and are younger than 42 years of age. ? You are older than 42 years of age. and your health care provider tells you that you are at risk for this type of infection. ? Your sexual activity has changed since you were last screened, and you are at increased  risk for chlamydia or gonorrhea. Ask your health care provider if you are at risk.  Ask your health care provider about whether you are at high risk for HIV. Your health care provider may recommend a prescription medicine to help prevent HIV infection. If you choose to take medicine to prevent HIV, you should first get tested for HIV. You should then be tested every 3 months for as long as you are taking the medicine. Pregnancy  If  you are about to stop having your period (premenopausal) and you may become pregnant, seek counseling before you get pregnant.  Take 400 to 800 micrograms (mcg) of folic acid every day if you become pregnant.  Ask for birth control (contraception) if you want to prevent pregnancy. Osteoporosis and menopause Osteoporosis is a disease in which the bones lose minerals and strength with aging. This can result in bone fractures. If you are 42 years old or older, or if you are at risk for osteoporosis and fractures, ask your health care provider if you should:  Be screened for bone loss.  Take a calcium or vitamin D supplement to lower your risk of fractures.  Be given hormone replacement therapy (HRT) to treat symptoms of menopause. Follow these instructions at home: Lifestyle  Do not use any products that contain nicotine or tobacco, such as cigarettes, e-cigarettes, and chewing tobacco. If you need help quitting, ask your health care provider.  Do not use street drugs.  Do not share needles.  Ask your health care provider for help if you need support or information about quitting drugs. Alcohol use  Do not drink alcohol if: ? Your health care provider tells you not to drink. ? You are pregnant, may be pregnant, or are planning to become pregnant.  If you drink alcohol: ? Limit how much you use to 0-1 drink a day. ? Limit intake if you are breastfeeding.  Be aware of how much alcohol is in your drink. In the [place] one drink equals one 12 oz bottle of beer (355 mL), one 5 oz glass of wine (148 mL), or one 1 oz glass of hard liquor (44 mL). General instructions  Schedule regular health, dental, and eye exams.  Stay current with your vaccines.  Tell your health care provider if: ? You often feel depressed. ? You have ever been abused or do not feel safe at home. Summary  Adopting a healthy lifestyle and getting preventive care are important in promoting health and wellness.   Follow your health care provider's instructions about healthy diet, exercising, and getting tested or screened for diseases.  Follow your health care provider's instructions on monitoring your cholesterol and blood pressure. This information is not intended to replace advice given to you by your health care provider. Make sure you discuss any questions you have with your health care provider. Document Released: 01/25/2011 Document Revised: 07/05/2018 Document Reviewed: 07/05/2018 Elsevier Patient Education  2020 [employer].

## 2019-03-01 LAB — COMPREHENSIVE METABOLIC PANEL
AG Ratio: 1.6 (calc) (ref 1.0–2.5)
ALT: 7 U/L (ref 6–29)
AST: 9 U/L — ABNORMAL LOW (ref 10–30)
Albumin: 4.1 g/dL (ref 3.6–5.1)
Alkaline phosphatase (APISO): 55 U/L (ref 31–125)
BUN: 12 mg/dL (ref 7–25)
CO2: 27 mmol/L (ref 20–32)
Calcium: 8.9 mg/dL (ref 8.6–10.2)
Chloride: 106 mmol/L (ref 98–110)
Creat: 0.89 mg/dL (ref 0.50–1.10)
Globulin: 2.5 g/dL (calc) (ref 1.9–3.7)
Glucose, Bld: 89 mg/dL (ref 65–99)
Potassium: 3.8 mmol/L (ref 3.5–5.3)
Sodium: 139 mmol/L (ref 135–146)
Total Bilirubin: 0.3 mg/dL (ref 0.2–1.2)
Total Protein: 6.6 g/dL (ref 6.1–8.1)

## 2019-03-01 LAB — LIPID PANEL
Cholesterol: 218 mg/dL — ABNORMAL HIGH (ref <200)
HDL: 41 mg/dL — ABNORMAL LOW (ref >=50)
LDL Cholesterol (Calc): 151 mg/dL (calc) — ABNORMAL HIGH
Non-HDL Cholesterol (Calc): 177 mg/dL (calc) — ABNORMAL HIGH (ref <130)
Total CHOL/HDL Ratio: 5.3 (calc) — ABNORMAL HIGH (ref <5.0)
Triglycerides: 133 mg/dL (ref <150)

## 2019-03-01 LAB — CBC WITH DIFFERENTIAL/PLATELET
Absolute Monocytes: 264 cells/uL (ref 200–950)
Basophils Absolute: 29 cells/uL (ref 0–200)
Basophils Relative: 0.6 %
Eosinophils Absolute: 163 cells/uL (ref 15–500)
Eosinophils Relative: 3.4 %
HCT: 34.8 % — ABNORMAL LOW (ref 35.0–45.0)
Hemoglobin: 11.9 g/dL (ref 11.7–15.5)
Lymphs Abs: 2112 cells/uL (ref 850–3900)
MCH: 30.4 pg (ref 27.0–33.0)
MCHC: 34.2 g/dL (ref 32.0–36.0)
MCV: 89 fL (ref 80.0–100.0)
MPV: 10.2 fL (ref 7.5–12.5)
Monocytes Relative: 5.5 %
Neutro Abs: 2232 cells/uL (ref 1500–7800)
Neutrophils Relative %: 46.5 %
Platelets: 185 10*3/uL (ref 140–400)
RBC: 3.91 10*6/uL (ref 3.80–5.10)
RDW: 13.4 % (ref 11.0–15.0)
Total Lymphocyte: 44 %
WBC: 4.8 10*3/uL (ref 3.8–10.8)

## 2019-03-02 ENCOUNTER — Encounter: Payer: Self-pay | Admitting: Women's Health

## 2019-03-08 ENCOUNTER — Other Ambulatory Visit: Payer: Self-pay | Admitting: Women's Health

## 2019-03-08 DIAGNOSIS — E038 Other specified hypothyroidism: Secondary | ICD-10-CM

## 2019-03-08 NOTE — Telephone Encounter (Signed)
Patient does see Dr.Balan and she checks TSH it has been normal.

## 2019-03-08 NOTE — Telephone Encounter (Signed)
Nancy no recent TSH, I wasn't sure if refill should be x 1 year since annual exam was on 02/28/19.

## 2019-03-08 NOTE — Telephone Encounter (Signed)
[place] for refill, TSH was not checked at annual, my fault, ask nichole if it can be added, if not please have her make lab appt for TSH.  Pl apologize for me, I think the endocrinologist used to monitor.

## 2019-03-16 ENCOUNTER — Other Ambulatory Visit: Payer: Self-pay | Admitting: Women's Health

## 2019-03-16 ENCOUNTER — Other Ambulatory Visit: Payer: Self-pay

## 2019-03-16 ENCOUNTER — Ambulatory Visit
Admission: RE | Admit: 2019-03-16 | Discharge: 2019-03-16 | Disposition: A | Discharge status: Home / Self Care | Payer: Self-pay | Source: Ambulatory Visit | Attending: Women's Health | Admitting: Women's Health

## 2019-03-16 DIAGNOSIS — Z1231 Encounter for screening mammogram for malignant neoplasm of breast: Secondary | ICD-10-CM | POA: Diagnosis not present

## 2019-04-19 DIAGNOSIS — I10 Essential (primary) hypertension: Secondary | ICD-10-CM | POA: Diagnosis not present

## 2019-04-19 DIAGNOSIS — E890 Postprocedural hypothyroidism: Secondary | ICD-10-CM | POA: Diagnosis not present

## 2019-04-19 DIAGNOSIS — R635 Abnormal weight gain: Secondary | ICD-10-CM | POA: Diagnosis not present

## 2019-05-30 ENCOUNTER — Other Ambulatory Visit: Payer: Self-pay | Admitting: Women's Health

## 2019-06-09 ENCOUNTER — Other Ambulatory Visit: Payer: Self-pay | Admitting: Women's Health

## 2019-06-09 DIAGNOSIS — E038 Other specified hypothyroidism: Secondary | ICD-10-CM

## 2019-08-01 ENCOUNTER — Encounter: Payer: Self-pay | Admitting: Women's Health

## 2019-08-01 ENCOUNTER — Other Ambulatory Visit: Payer: Self-pay

## 2019-08-01 ENCOUNTER — Ambulatory Visit (INDEPENDENT_AMBULATORY_CARE_PROVIDER_SITE_OTHER): Payer: BC Managed Care – PPO | Admitting: Women's Health

## 2019-08-01 VITALS — BP 133/80

## 2019-08-01 DIAGNOSIS — R350 Frequency of micturition: Secondary | ICD-10-CM

## 2019-08-01 MED ORDER — SULFAMETHOXAZOLE-TRIMETHOPRIM 800-160 MG PO TABS: 1.0000 | ORAL_TABLET | Freq: Two times a day (BID) | ORAL | 0 refills | Status: DC

## 2019-08-01 NOTE — Patient Instructions (Signed)
Urinary Tract Infection, Adult A urinary tract infection (UTI) is an infection of any part of the urinary tract. The urinary tract includes:  The kidneys.  The ureters.  The bladder.  The urethra. These organs make, store, and get rid of pee (urine) in the body. What are the causes? This is caused by germs (bacteria) in your genital area. These germs grow and cause swelling (inflammation) of your urinary tract. What increases the risk? You are more likely to develop this condition if:  You have a small, thin tube (catheter) to drain pee.  You cannot control when you pee or poop (incontinence).  You are female, and: ? You use these methods to prevent pregnancy:  A medicine that kills sperm (spermicide).  A device that blocks sperm (diaphragm). ? You have low levels of a female hormone (estrogen). ? You are pregnant.  You have genes that add to your risk.  You are sexually active.  You take antibiotic medicines.  You have trouble peeing because of: ? A prostate that is bigger than normal, if you are female. ? A blockage in the part of your body that drains pee from the bladder (urethra). ? A kidney stone. ? A nerve condition that affects your bladder (neurogenic bladder). ? Not getting enough to drink. ? Not peeing often enough.  You have other conditions, such as: ? Diabetes. ? A weak disease-fighting system (immune system). ? Sickle cell disease. ? Gout. ? Injury of the spine. What are the signs or symptoms? Symptoms of this condition include:  Needing to pee right away (urgently).  Peeing often.  Peeing small amounts often.  Pain or burning when peeing.  Blood in the pee.  Pee that smells bad or not like normal.  Trouble peeing.  Pee that is cloudy.  Fluid coming from the vagina, if you are female.  Pain in the belly or lower back. Other symptoms include:  Throwing up (vomiting).  No urge to eat.  Feeling mixed up (confused).  Being tired  and grouchy (irritable).  A fever.  Watery poop (diarrhea). How is this treated? This condition may be treated with:  Antibiotic medicine.  Other medicines.  Drinking enough water. Follow these instructions at home:  Medicines  Take over-the-counter and prescription medicines only as told by your doctor.  If you were prescribed an antibiotic medicine, take it as told by your doctor. Do not stop taking it even if you start to feel better. General instructions  Make sure you: ? Pee until your bladder is empty. ? Do not hold pee for a long time. ? Empty your bladder after sex. ? Wipe from front to back after pooping if you are a female. Use each tissue one time when you wipe.  Drink enough fluid to keep your pee pale yellow.  Keep all follow-up visits as told by your doctor. This is important. Contact a doctor if:  You do not get better after 1-2 days.  Your symptoms go away and then come back. Get help right away if:  You have very bad back pain.  You have very bad pain in your lower belly.  You have a fever.  You are sick to your stomach (nauseous).  You are throwing up. Summary  A urinary tract infection (UTI) is an infection of any part of the urinary tract.  This condition is caused by germs in your genital area.  There are many risk factors for a UTI. These include having a small, thin   tube to drain pee and not being able to control when you pee or poop.  Treatment includes antibiotic medicines for germs.  Drink enough fluid to keep your pee pale yellow. This information is not intended to replace advice given to you by your health care provider. Make sure you discuss any questions you have with your health care provider. Document Revised: 06/29/2018 Document Reviewed: 01/19/2018 Elsevier Patient Education  2020 Else[employer] Inc[employer]  

## 2019-08-01 NOTE — Progress Notes (Signed)
44 year old engaged BF G3, P2 presents with complaint of increased urinary frequency, urgency, pain at end of stream urination for the past 5 days symptoms getting progressively worse.  Denies vaginal discharge, abdominal pain, or fever.  States has a right flank area discomfort when bladder is full which is relieved after urinating.  No change in routine.  History of recurrent BV but no symptoms today or recently.  2014 TVH for fibroids.    Hypothyroid endocrinologist manages.  Exam: Appears well.  No CVAT.  Abdomen soft, no rebound or radiation of pain.  External genitalia within normal limits, speculum exam mild dryness without discharge.  UA: +1 leukocytes, 20-40 WBCs, no RBCs, moderate bacteria,  UTI  Plan: Septra twice daily for 3 days, prescription, proper use given and reviewed.  UTI prevention discussed.  Reviewed importance of drinking enough water.  Instructed to call if continued problems.  Urine culture pending.

## 2019-08-03 LAB — URINALYSIS, COMPLETE W/RFL CULTURE
Bilirubin Urine: NEGATIVE
Glucose, UA: NEGATIVE
Hgb urine dipstick: NEGATIVE
Hyaline Cast: NONE SEEN /LPF
Ketones, ur: NEGATIVE
Nitrites, Initial: NEGATIVE
Protein, ur: NEGATIVE
RBC / HPF: NONE SEEN /HPF (ref 0–2)
Specific Gravity, Urine: 1.011 (ref 1.001–1.03)
pH: 8 (ref 5.0–8.0)

## 2019-08-03 LAB — CULTURE INDICATED

## 2019-08-03 LAB — URINE CULTURE
MICRO NUMBER:: 10013089
SPECIMEN QUALITY:: ADEQUATE

## 2019-08-07 NOTE — Telephone Encounter (Signed)
Called patient and per DPR access note on file I Left detailed message in voice mail reading her the unread My Chart email message.

## 2019-08-09 DIAGNOSIS — E890 Postprocedural hypothyroidism: Secondary | ICD-10-CM | POA: Diagnosis not present

## 2019-09-19 ENCOUNTER — Other Ambulatory Visit: Payer: Self-pay

## 2019-09-19 MED ORDER — IBUPROFEN 600 MG PO TABS: ORAL_TABLET | ORAL | 1 refills | Status: DC

## 2019-09-28 DIAGNOSIS — E890 Postprocedural hypothyroidism: Secondary | ICD-10-CM | POA: Diagnosis not present

## 2019-09-28 DIAGNOSIS — R635 Abnormal weight gain: Secondary | ICD-10-CM | POA: Diagnosis not present

## 2019-09-28 DIAGNOSIS — I10 Essential (primary) hypertension: Secondary | ICD-10-CM | POA: Diagnosis not present

## 2019-12-22 DIAGNOSIS — Z03818 Encounter for observation for suspected exposure to other biological agents ruled out: Secondary | ICD-10-CM | POA: Diagnosis not present

## 2019-12-22 DIAGNOSIS — Z20828 Contact with and (suspected) exposure to other viral communicable diseases: Secondary | ICD-10-CM | POA: Diagnosis not present

## 2019-12-31 ENCOUNTER — Other Ambulatory Visit: Payer: Self-pay

## 2019-12-31 DIAGNOSIS — B009 Herpesviral infection, unspecified: Secondary | ICD-10-CM

## 2019-12-31 MED ORDER — IBUPROFEN 600 MG PO TABS: ORAL_TABLET | ORAL | 1 refills | Status: DC

## 2019-12-31 MED ORDER — VALACYCLOVIR HCL 500 MG PO TABS: ORAL_TABLET | ORAL | 1 refills | Status: DC

## 2019-12-31 NOTE — Telephone Encounter (Signed)
Annual exam scheduled 03/03/20 with Jonelle Sidle, NP.

## 2020-03-03 ENCOUNTER — Encounter: Payer: BC Managed Care – PPO | Admitting: Nurse Practitioner

## 2020-03-06 ENCOUNTER — Ambulatory Visit (INDEPENDENT_AMBULATORY_CARE_PROVIDER_SITE_OTHER): Payer: BC Managed Care – PPO | Admitting: Nurse Practitioner

## 2020-03-06 ENCOUNTER — Encounter: Payer: Self-pay | Admitting: Nurse Practitioner

## 2020-03-06 ENCOUNTER — Other Ambulatory Visit: Payer: Self-pay

## 2020-03-06 VITALS — BP 124/80 | Ht 65.5 in | Wt 208.0 lb

## 2020-03-06 DIAGNOSIS — Z90710 Acquired absence of both cervix and uterus: Secondary | ICD-10-CM | POA: Diagnosis not present

## 2020-03-06 DIAGNOSIS — Z113 Encounter for screening for infections with a predominantly sexual mode of transmission: Secondary | ICD-10-CM | POA: Diagnosis not present

## 2020-03-06 DIAGNOSIS — B9689 Other specified bacterial agents as the cause of diseases classified elsewhere: Secondary | ICD-10-CM

## 2020-03-06 DIAGNOSIS — Z01419 Encounter for gynecological examination (general) (routine) without abnormal findings: Secondary | ICD-10-CM | POA: Diagnosis not present

## 2020-03-06 DIAGNOSIS — N760 Acute vaginitis: Secondary | ICD-10-CM

## 2020-03-06 DIAGNOSIS — B009 Herpesviral infection, unspecified: Secondary | ICD-10-CM | POA: Diagnosis not present

## 2020-03-06 MED ORDER — METRONIDAZOLE 0.75 % VA GEL: VAGINAL | 4 refills | Status: DC

## 2020-03-06 MED ORDER — VALACYCLOVIR HCL 500 MG PO TABS: ORAL_TABLET | ORAL | 1 refills | Status: DC

## 2020-03-06 NOTE — Progress Notes (Signed)
   Kendra Griffin July 14, 1976 [id number]   History:  44 y.o. [id number] presents for annual exam without GYN complaints. 2014 TAH for fibroids. Normal pap history. HSV, rare outbreaks. History of recurrent BV, Metrogel as needed. Hyperthyroidism managed by endocrinology. Requesting STD testing today, one partner.   Gynecologic History Patient's last menstrual period was 01/04/2013.   Contraception: status post hysterectomy Last Pap: 2014. Results were: normal Last mammogram: 03/19/2019. Results were: normal   Past medical history, past surgical history, family history and social history were all reviewed and documented in the EPIC chart.  ROS:  A ROS was performed and pertinent positives and negatives are included.  Exam:  Vitals:   03/06/20 1528  BP: 124/80  Weight: 208 lb (94.3 kg)  Height: 5' 5.5" (1.664 m)   Body mass index is 34.09 kg/m.  General appearance:  Normal Thyroid:  Symmetrical, normal in size, without palpable masses or nodularity. Respiratory  Auscultation:  Clear without wheezing or rhonchi Cardiovascular  Auscultation:  Regular rate, without rubs, murmurs or gallops  Edema/varicosities:  Not grossly evident Abdominal  Soft,nontender, without masses, guarding or rebound.  Liver/spleen:  No organomegaly noted  Hernia:  None appreciated  Skin  Inspection:  Grossly normal   Breasts: Examined lying and sitting.   Right: Without masses, retractions, discharge or axillary adenopathy.   Left: Without masses, retractions, discharge or axillary adenopathy. Gentitourinary   Inguinal/mons:  Normal without inguinal adenopathy  External genitalia:  Normal  BUS/Urethra/Skene's glands:  Normal  Vagina:  Normal  Cervix:  Absent  Uterus:  Absent  Adnexa/parametria:     Rt: Without masses or tenderness.   Lt: Without masses or tenderness.  Anus and perineum: Normal  Digital rectal exam: Normal sphincter tone without palpated masses or  tenderness  Assessment/Plan:  44 y.o. [id number] for annual exam.   Well female exam with routine gynecological exam - Plan: CBC with Differential/Platelet, Comprehensive metabolic panel. Education provided on SBEs, importance of preventative screenings, current guidelines, high calcium diet, regular exercise, and multivitamin daily.   HSV-2 (herpes simplex virus 2) infection - rare outbreaks. Valtrex as needed. Refill provided.   History of total abdominal hysterectomy - 2014. No HRT  Bacterial vaginosis - Plan: metroNIDAZOLE (METROGEL VAGINAL) 0.75 % vaginal gel as needed  Screen for STD (sexually transmitted disease) - Plan: HIV Antibody (routine testing w rflx), RPR, C. trachomatis/N. gonorrhoeae RNA  Follow up in 1 year for annual     [place], 3:32 PM 03/06/2020

## 2020-03-06 NOTE — Patient Instructions (Signed)
Health Maintenance, Female Adopting a healthy lifestyle and getting preventive care are important in promoting health and wellness. Ask your health care provider about:  The right schedule for you to have regular tests and exams.  Things you can do on your own to prevent diseases and keep yourself healthy. What should I know about diet, weight, and exercise? Eat a healthy diet   Eat a diet that includes plenty of vegetables, fruits, low-fat dairy products, and lean protein.  Do not eat a lot of foods that are high in solid fats, added sugars, or sodium. Maintain a healthy weight Body mass index (BMI) is used to identify weight problems. It estimates body fat based on height and weight. Your health care provider can help determine your BMI and help you achieve or maintain a healthy weight. Get regular exercise Get regular exercise. This is one of the most important things you can do for your health. Most adults should:  Exercise for at least 150 minutes each week. The exercise should increase your heart rate and make you sweat (moderate-intensity exercise).  Do strengthening exercises at least twice a week. This is in addition to the moderate-intensity exercise.  Spend less time sitting. Even light physical activity can be beneficial. Watch cholesterol and blood lipids Have your blood tested for lipids and cholesterol at 43 years of age, then have this test every 5 years. Have your cholesterol levels checked more often if:  Your lipid or cholesterol levels are high.  You are older than 43 years of age.  You are at high risk for heart disease. What should I know about cancer screening? Depending on your health history and family history, you may need to have cancer screening at various ages. This may include screening for:  Breast cancer.  Cervical cancer.  Colorectal cancer.  Skin cancer.  Lung cancer. What should I know about heart disease, diabetes, and high blood  pressure? Blood pressure and heart disease  High blood pressure causes heart disease and increases the risk of stroke. This is more likely to develop in people who have high blood pressure readings, are of African descent, or are overweight.  Have your blood pressure checked: ? Every 3-5 years if you are 18-39 years of age. ? Every year if you are 40 years old or older. Diabetes Have regular diabetes screenings. This checks your fasting blood sugar level. Have the screening done:  Once every three years after age 40 if you are at a normal weight and have a low risk for diabetes.  More often and at a younger age if you are overweight or have a high risk for diabetes. What should I know about preventing infection? Hepatitis B If you have a higher risk for hepatitis B, you should be screened for this virus. Talk with your health care provider to find out if you are at risk for hepatitis B infection. Hepatitis C Testing is recommended for:  Everyone born from 1945 through 1965.  Anyone with known risk factors for hepatitis C. Sexually transmitted infections (STIs)  Get screened for STIs, including gonorrhea and chlamydia, if: ? You are sexually active and are younger than 43 years of age. ? You are older than 43 years of age and your health care provider tells you that you are at risk for this type of infection. ? Your sexual activity has changed since you were last screened, and you are at increased risk for chlamydia or gonorrhea. Ask your health care provider if   you are at risk.  Ask your health care provider about whether you are at high risk for HIV. Your health care provider may recommend a prescription medicine to help prevent HIV infection. If you choose to take medicine to prevent HIV, you should first get tested for HIV. You should then be tested every 3 months for as long as you are taking the medicine. Pregnancy  If you are about to stop having your period (premenopausal) and  you may become pregnant, seek counseling before you get pregnant.  Take 400 to 800 micrograms (mcg) of folic acid every day if you become pregnant.  Ask for birth control (contraception) if you want to prevent pregnancy. Osteoporosis and menopause Osteoporosis is a disease in which the bones lose minerals and strength with aging. This can result in bone fractures. If you are 65 years old or older, or if you are at risk for osteoporosis and fractures, ask your health care provider if you should:  Be screened for bone loss.  Take a calcium or vitamin D supplement to lower your risk of fractures.  Be given hormone replacement therapy (HRT) to treat symptoms of menopause. Follow these instructions at home: Lifestyle  Do not use any products that contain nicotine or tobacco, such as cigarettes, e-cigarettes, and chewing tobacco. If you need help quitting, ask your health care provider.  Do not use street drugs.  Do not share needles.  Ask your health care provider for help if you need support or information about quitting drugs. Alcohol use  Do not drink alcohol if: ? Your health care provider tells you not to drink. ? You are pregnant, may be pregnant, or are planning to become pregnant.  If you drink alcohol: ? Limit how much you use to 0-1 drink a day. ? Limit intake if you are breastfeeding.  Be aware of how much alcohol is in your drink. In the [place] one drink equals one 12 oz bottle of beer (355 mL), one 5 oz glass of wine (148 mL), or one 1 oz glass of hard liquor (44 mL). General instructions  Schedule regular health, dental, and eye exams.  Stay current with your vaccines.  Tell your health care provider if: ? You often feel depressed. ? You have ever been abused or do not feel safe at home. Summary  Adopting a healthy lifestyle and getting preventive care are important in promoting health and wellness.  Follow your health care provider's instructions about healthy  diet, exercising, and getting tested or screened for diseases.  Follow your health care provider's instructions on monitoring your cholesterol and blood pressure. This information is not intended to replace advice given to you by your health care provider. Make sure you discuss any questions you have with your health care provider. Document Revised: 07/05/2018 Document Reviewed: 07/05/2018 Elsevier Patient Education  2020 [employer]  

## 2020-03-07 LAB — COMPREHENSIVE METABOLIC PANEL
AG Ratio: 1.6 (calc) (ref 1.0–2.5)
ALT: 9 U/L (ref 6–29)
AST: 12 U/L (ref 10–30)
Albumin: 3.9 g/dL (ref 3.6–5.1)
Alkaline phosphatase (APISO): 52 U/L (ref 31–125)
BUN: 17 mg/dL (ref 7–25)
CO2: 27 mmol/L (ref 20–32)
Calcium: 8.8 mg/dL (ref 8.6–10.2)
Chloride: 106 mmol/L (ref 98–110)
Creat: 1.01 mg/dL (ref 0.50–1.10)
Globulin: 2.5 g/dL (calc) (ref 1.9–3.7)
Glucose, Bld: 105 mg/dL — ABNORMAL HIGH (ref 65–99)
Potassium: 3.6 mmol/L (ref 3.5–5.3)
Sodium: 138 mmol/L (ref 135–146)
Total Bilirubin: 0.4 mg/dL (ref 0.2–1.2)
Total Protein: 6.4 g/dL (ref 6.1–8.1)

## 2020-03-07 LAB — RPR: RPR Ser Ql: NONREACTIVE

## 2020-03-07 LAB — CBC WITH DIFFERENTIAL/PLATELET
Absolute Monocytes: 382 cells/uL (ref 200–950)
Basophils Absolute: 29 cells/uL (ref 0–200)
Basophils Relative: 0.6 %
Eosinophils Absolute: 59 cells/uL (ref 15–500)
Eosinophils Relative: 1.2 %
HCT: 34.7 % — ABNORMAL LOW (ref 35.0–45.0)
Hemoglobin: 11.9 g/dL (ref 11.7–15.5)
Lymphs Abs: 1568 cells/uL (ref 850–3900)
MCH: 30.1 pg (ref 27.0–33.0)
MCHC: 34.3 g/dL (ref 32.0–36.0)
MCV: 87.8 fL (ref 80.0–100.0)
MPV: 10.2 fL (ref 7.5–12.5)
Monocytes Relative: 7.8 %
Neutro Abs: 2862 cells/uL (ref 1500–7800)
Neutrophils Relative %: 58.4 %
Platelets: 203 10*3/uL (ref 140–400)
RBC: 3.95 10*6/uL (ref 3.80–5.10)
RDW: 13.3 % (ref 11.0–15.0)
Total Lymphocyte: 32 %
WBC: 4.9 10*3/uL (ref 3.8–10.8)

## 2020-03-07 LAB — HIV ANTIBODY (ROUTINE TESTING W REFLEX): HIV 1&2 Ab, 4th Generation: NONREACTIVE

## 2020-03-07 LAB — C. TRACHOMATIS/N. GONORRHOEAE RNA
C. trachomatis RNA, TMA: NOT DETECTED
N. gonorrhoeae RNA, TMA: NOT DETECTED

## 2020-04-25 DIAGNOSIS — Z03818 Encounter for observation for suspected exposure to other biological agents ruled out: Secondary | ICD-10-CM | POA: Diagnosis not present

## 2020-04-25 DIAGNOSIS — Z20822 Contact with and (suspected) exposure to covid-19: Secondary | ICD-10-CM | POA: Diagnosis not present

## 2020-05-07 DIAGNOSIS — E890 Postprocedural hypothyroidism: Secondary | ICD-10-CM | POA: Diagnosis not present

## 2020-05-19 DIAGNOSIS — Z20822 Contact with and (suspected) exposure to covid-19: Secondary | ICD-10-CM | POA: Diagnosis not present

## 2020-05-19 DIAGNOSIS — Z03818 Encounter for observation for suspected exposure to other biological agents ruled out: Secondary | ICD-10-CM | POA: Diagnosis not present

## 2020-06-17 ENCOUNTER — Other Ambulatory Visit: Payer: Self-pay

## 2020-06-17 ENCOUNTER — Encounter (HOSPITAL_COMMUNITY): Payer: Self-pay | Admitting: Emergency Medicine

## 2020-06-17 ENCOUNTER — Emergency Department (HOSPITAL_COMMUNITY)
Admission: EM | Admit: 2020-06-17 | Discharge: 2020-06-17 | Disposition: A | Discharge status: Home / Self Care | Payer: BC Managed Care – PPO | Attending: Emergency Medicine | Admitting: Emergency Medicine

## 2020-06-17 DIAGNOSIS — E039 Hypothyroidism, unspecified: Secondary | ICD-10-CM | POA: Insufficient documentation

## 2020-06-17 DIAGNOSIS — Z79899 Other long term (current) drug therapy: Secondary | ICD-10-CM | POA: Insufficient documentation

## 2020-06-17 DIAGNOSIS — S060X0A Concussion without loss of consciousness, initial encounter: Secondary | ICD-10-CM | POA: Diagnosis not present

## 2020-06-17 DIAGNOSIS — S0990XA Unspecified injury of head, initial encounter: Secondary | ICD-10-CM | POA: Diagnosis not present

## 2020-06-17 DIAGNOSIS — Y92410 Unspecified street and highway as the place of occurrence of the external cause: Secondary | ICD-10-CM | POA: Diagnosis not present

## 2020-06-17 MED ORDER — IBUPROFEN 800 MG PO TABS: 800.0000 mg | ORAL_TABLET | Freq: Three times a day (TID) | ORAL | 0 refills | Status: DC

## 2020-06-17 MED ORDER — METHOCARBAMOL 500 MG PO TABS: 500.0000 mg | ORAL_TABLET | Freq: Two times a day (BID) | ORAL | 0 refills | Status: DC

## 2020-06-17 MED ORDER — DIPHENHYDRAMINE HCL 50 MG/ML IJ SOLN
25.0000 mg | Freq: Once | INTRAMUSCULAR | Status: DC
  Filled 2020-06-17: qty 1

## 2020-06-17 MED ORDER — KETOROLAC TROMETHAMINE 60 MG/2ML IM SOLN
60.0000 mg | Freq: Once | INTRAMUSCULAR | Status: AC
  Administered 2020-06-17: 60 mg via INTRAMUSCULAR
  Filled 2020-06-17: qty 2

## 2020-06-17 MED ORDER — PROCHLORPERAZINE EDISYLATE 10 MG/2ML IJ SOLN
10.0000 mg | Freq: Once | INTRAMUSCULAR | Status: DC
  Filled 2020-06-17: qty 2
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
## 2020-06-26 ENCOUNTER — Telehealth: Payer: Self-pay | Admitting: *Deleted

## 2020-06-26 NOTE — Telephone Encounter (Signed)
Patient informed with below, she asked me to send same names via my chart of PCP.
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
## 2020-06-26 NOTE — Telephone Encounter (Signed)
I recommend her seeing a primary care provider for further evaluation. I would not go straight to a chiropractor without being assessed more thoroughly. I do not see where they did any radiology testing in the ER and this may be needed. They will recommend her to ortho if needed. The pain may be muscular. Can you provide her with some recommendations for PCPs?

## 2020-06-30 ENCOUNTER — Telehealth: Payer: Self-pay

## 2020-06-30 NOTE — Telephone Encounter (Signed)
Left message for patient to call back to be scheduled in concussion clinic.
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
## 2020-07-07 NOTE — Telephone Encounter (Signed)
Pt left a vmail on my phone this morning requesting a call back to schedule a concussion f/u. [phone]
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
## 2020-07-16 ENCOUNTER — Other Ambulatory Visit: Payer: Self-pay

## 2020-07-16 ENCOUNTER — Ambulatory Visit (INDEPENDENT_AMBULATORY_CARE_PROVIDER_SITE_OTHER): Payer: BC Managed Care – PPO | Admitting: Family Medicine

## 2020-07-16 ENCOUNTER — Ambulatory Visit (INDEPENDENT_AMBULATORY_CARE_PROVIDER_SITE_OTHER): Payer: BC Managed Care – PPO

## 2020-07-16 VITALS — BP 116/80 | HR 87 | Ht 66.0 in | Wt 214.6 lb

## 2020-07-16 DIAGNOSIS — M62838 Other muscle spasm: Secondary | ICD-10-CM | POA: Diagnosis not present

## 2020-07-16 DIAGNOSIS — M542 Cervicalgia: Secondary | ICD-10-CM | POA: Diagnosis not present

## 2020-07-16 DIAGNOSIS — S060X0A Concussion without loss of consciousness, initial encounter: Secondary | ICD-10-CM | POA: Diagnosis not present

## 2020-07-16 MED ORDER — IBUPROFEN 800 MG PO TABS
800.0000 mg | ORAL_TABLET | Freq: Three times a day (TID) | ORAL | 0 refills | Status: DC
Start: 2020-07-16 — End: 2021-02-19

## 2020-07-16 MED ORDER — TIZANIDINE HCL 4 MG PO TABS: 4.0000 mg | ORAL_TABLET | Freq: Four times a day (QID) | ORAL | 1 refills | Status: DC | PRN

## 2020-07-16 NOTE — Patient Instructions (Addendum)
Thank you for coming in today.  Recheck in about 1 month.   Let me know if this is not working.   I've referred you to Physical Therapy.  Let us know if you don't hear from them in one week.  Please get an Xray today before you leave  Try heating pad.   Try the near muscle relaxer.   Try TENS unit.   TENS UNIT: This is helpful for muscle pain and spasm.   Search and Purchase a TENS 7000 2nd edition at  www.tenspros.com or www.[employer].com It should be less than $30.     TENS unit instructions: Do not shower or bathe with the unit on . Turn the unit off before removing electrodes or batteries . If the electrodes lose stickiness add a drop of water to the electrodes after they are disconnected from the unit and place on plastic sheet. If you continued to have difficulty, call the TENS unit company to purchase more electrodes. . Do not apply lotion on the skin area prior to use. Make sure the skin is clean and dry as this will help prolong the life of the electrodes. . After use, always check skin for unusual red areas, rash or other skin difficulties. If there are any skin problems, does not apply electrodes to the same area. . Never remove the electrodes from the unit by pulling the wires. . Do not use the TENS unit or electrodes other than as directed. . Do not change electrode placement without consultating your therapist or physician. Marland Kitchen Keep 2 fingers with between each electrode. . Wear time ratio is 2:1, on to off times.    For example on for 30 minutes off for 15 minutes and then on for 30 minutes off for 15 minutes     Cervical Strain and Sprain Rehab Ask your health care provider which exercises are safe for you. Do exercises exactly as told by your health care provider and adjust them as directed. It is normal to feel mild stretching, pulling, tightness, or discomfort as you do these exercises. Stop right away if you feel sudden pain or your pain gets worse. Do not  begin these exercises until told by your health care provider. Stretching and range-of-motion exercises Cervical side bending  Using good posture, sit on a stable chair or stand up. Without moving your shoulders, slowly tilt your left / right ear to your shoulder until you feel a stretch in the opposite side neck muscles. You should be looking straight ahead. Hold for __________ seconds. Repeat with the other side of your neck. Repeat __________ times. Complete this exercise __________ times a day. Cervical rotation  Using good posture, sit on a stable chair or stand up. Slowly turn your head to the side as if you are looking over your left / right shoulder. Keep your eyes level with the ground. Stop when you feel a stretch along the side and the back of your neck. Hold for __________ seconds. Repeat this by turning to your other side. Repeat __________ times. Complete this exercise __________ times a day. Thoracic extension and pectoral stretch Roll a towel or a small blanket so it is about 4 inches (10 cm) in diameter. Lie down on your back on a firm surface. Put the towel lengthwise, under your spine in the middle of your back. It should not be under your shoulder blades. The towel should line up with your spine from your middle back to your lower back. Put your  hands behind your head and let your elbows fall out to your sides. Hold for __________ seconds. Repeat __________ times. Complete this exercise __________ times a day. Strengthening exercises Isometric upper cervical flexion Lie on your back with a thin pillow behind your head and a small rolled-up towel under your neck. Gently tuck your chin toward your chest and nod your head down to look toward your feet. Do not lift your head off the pillow. Hold for __________ seconds. Release the tension slowly. Relax your neck muscles completely before you repeat this exercise. Repeat __________ times. Complete this exercise  __________ times a day. Isometric cervical extension  Stand about 6 inches (15 cm) away from a wall, with your back facing the wall. Place a soft object, about 6-8 inches (15-20 cm) in diameter, between the back of your head and the wall. A soft object could be a small pillow, a ball, or a folded towel. Gently tilt your head back and press into the soft object. Keep your jaw and forehead relaxed. Hold for __________ seconds. Release the tension slowly. Relax your neck muscles completely before you repeat this exercise. Repeat __________ times. Complete this exercise __________ times a day. Posture and body mechanics Body mechanics refers to the movements and positions of your body while you do your daily activities. Posture is part of body mechanics. Good posture and healthy body mechanics can help to relieve stress in your body's tissues and joints. Good posture means that your spine is in its natural S-curve position (your spine is neutral), your shoulders are pulled back slightly, and your head is not tipped forward. The following are general guidelines for applying improved posture and body mechanics to your everyday activities. Sitting  When sitting, keep your spine neutral and keep your feet flat on the floor. Use a footrest, if necessary, and keep your thighs parallel to the floor. Avoid rounding your shoulders, and avoid tilting your head forward. When working at a desk or a computer, keep your desk at a height where your hands are slightly lower than your elbows. Slide your chair under your desk so you are close enough to maintain good posture. When working at a computer, place your monitor at a height where you are looking straight ahead and you do not have to tilt your head forward or downward to look at the screen. Standing  When standing, keep your spine neutral and keep your feet about hip-width apart. Keep a slight bend in your knees. Your ears, shoulders, and hips should line  up. When you do a task in which you stand in one place for a long time, place one foot up on a stable object that is 2-4 inches (5-10 cm) high, such as a footstool. This helps keep your spine neutral. Resting When lying down and resting, avoid positions that are most painful for you. Try to support your neck in a neutral position. You can use a contour pillow or a small rolled-up towel. Your pillow should support your neck but not push on it. This information is not intended to replace advice given to you by your health care provider. Make sure you discuss any questions you have with your health care provider. Document Revised: 11/01/2018 Document Reviewed: 04/12/2018 Elsevier Patient Education  [employer].

## 2020-07-17 NOTE — Progress Notes (Signed)
X-ray of the cervical spine shows evidence of muscle spasm without fracture.  No severe arthritis present.

## 2020-07-23 ENCOUNTER — Other Ambulatory Visit: Payer: Self-pay

## 2020-07-23 ENCOUNTER — Encounter: Payer: Self-pay | Admitting: Physical Therapy

## 2020-07-23 ENCOUNTER — Ambulatory Visit: Payer: BC Managed Care – PPO | Attending: Family Medicine | Admitting: Physical Therapy

## 2020-07-23 DIAGNOSIS — M62838 Other muscle spasm: Secondary | ICD-10-CM | POA: Diagnosis not present

## 2020-07-23 DIAGNOSIS — M542 Cervicalgia: Secondary | ICD-10-CM | POA: Diagnosis not present

## 2020-07-24 ENCOUNTER — Encounter: Payer: Self-pay | Admitting: Physical Therapy
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
## 2020-07-30 ENCOUNTER — Ambulatory Visit: Payer: BC Managed Care – PPO | Attending: Family Medicine | Admitting: Rehabilitative and Restorative Service Providers"

## 2020-07-30 ENCOUNTER — Other Ambulatory Visit: Payer: Self-pay

## 2020-07-30 ENCOUNTER — Encounter: Payer: Self-pay | Admitting: Rehabilitative and Restorative Service Providers"

## 2020-07-30 DIAGNOSIS — M62838 Other muscle spasm: Secondary | ICD-10-CM | POA: Insufficient documentation

## 2020-07-30 DIAGNOSIS — M542 Cervicalgia: Secondary | ICD-10-CM | POA: Insufficient documentation

## 2020-07-30 NOTE — Patient Instructions (Signed)
Advised pt she may be sore tomorrow and to use heat tonight and do stretches and to continue to do heat and stretches at home for pain relief

## 2020-07-30 NOTE — Therapy (Signed)
[place], [place], [address] Phone: [phone]   Fax:  [phone]  Physical Therapy Treatment  Patient Details  Name: Kendra Griffin MRN: [id number] Date of Birth: [birth year] Referring Provider (PT): Dr Lynne Leader   Encounter Date: 07/30/2020   PT End of Session - 07/30/20 1716    Visit Number 2    Number of Visits 12    PT Start Time [id number]    PT Stop Time 0527    PT Time Calculation (min) 52 min    Activity Tolerance Patient tolerated treatment well;No increased pain    Behavior During Therapy WFL for tasks assessed/performed           Past Medical History:  Diagnosis Date  . Bacterial vaginitis    Recurrent  . Hernia   . Hypothyroidism   . Stress headaches    work related stress    Past Surgical History:  Procedure Laterality Date  . ABDOMINAL HYSTERECTOMY N/A 04/16/2013   Procedure: HYSTERECTOMY ABDOMINAL;  Surgeon: Anastasio Auerbach, MD;  Location: [facility];  Service: Gynecology;  Laterality: N/A;  . CESAREAN SECTION    . HERNIA REPAIR    . LAPAROSCOPY N/A 04/16/2013   Procedure: LAPAROSCOPY DIAGNOSTIC;  Surgeon: Anastasio Auerbach, MD;  Location: [facility];  Service: Gynecology;  Laterality: N/A;  . MYOMECTOMY      There were no vitals filed for this visit.   Subjective Assessment - 07/30/20 1636    Subjective 5/10 both sides cervical R > L. she states that the pain has improved from the evaluation and that she is doing her exercises at home.    How long can you sit comfortably? has to sit at work which can cause pain because of what she is doing    How long can you stand comfortably? N/A    How long can you walk comfortably? N/A    Currently in Pain? Yes    Pain Score 5     Pain Location Neck    Pain Orientation Right;Left    Pain Descriptors / Indicators Aching;Tightness    Pain Onset More than a month ago    Pain Frequency Constant    Multiple Pain Sites No                              [facility] Adult PT Treatment/Exercise - 07/30/20 0001      Modalities   Modalities Moist Heat;Ultrasound      Moist Heat Therapy   Number Minutes Moist Heat 10 Minutes    Moist Heat Location --   cervical supine     Ultrasound   Ultrasound Location Bil Upper Trap/Levator    Ultrasound Parameters 100% 2.5 w/cm2 x 8 min (4 min each area)    Ultrasound Goals Pain      Manual Therapy   Manual Therapy --   soft tissue mobilization, trigger point bil Upper Trap, Levator, Scalenes     Neck Exercises: Stretches   Upper Trapezius Stretch --   1 rep 45 sec performed during manual   Levator Stretch --   1 rep, 45 sec performed during manual; each side                   PT Short Term Goals - 07/30/20 1838      PT SHORT TERM GOAL #1   Title Patient will increase cervical rotation by 20  degrees bilateral    Status On-going      PT SHORT TERM GOAL #2   Title Patient will increase cervical extension by 10 degrees    Status On-going      PT SHORT TERM GOAL #3   Title Patient will increase right UE strength to 5/5    Status On-going      PT SHORT TERM GOAL #4   Title Therapy will review FOTO with the patient    Status On-going             PT Long Term Goals - 07/24/20 1249      PT LONG TERM GOAL #1   Title Patient will reach behind her head and back without pain    Time 6    Period Weeks    Status New    Target Date 09/04/20      PT LONG TERM GOAL #2   Title Patient will turn her head 70 degrees bilateral to  perfrom work tasks and drive without pain    Time 6    Period Weeks    Status New    Target Date 09/04/20      PT LONG TERM GOAL #3   Title Patient will report no headaches with daily activity    Time 6    Period Weeks    Status New    Target Date 09/04/20                 Plan - 07/30/20 1836    Clinical Impression Statement Pt presents to PT stating that her pain has been reduced since doing the exercises  at home but that it is still present. Pt with continued bil cervical pain extending to periscapular region. Pt with increased cervical tightness R > L. Pt would benefit from further PT for modalities, manual therapy, postural strengthening exercises to decrease pain and improve function.    PT Treatment/Interventions ADLs/Self Care Home Management;Cryotherapy;Electrical Stimulation;Moist Heat;Ultrasound;Gait training;Stair training;Functional mobility training;Therapeutic activities;Therapeutic exercise;Neuromuscular re-education;Patient/family education;Manual techniques;Passive range of motion;Dry needling;Taping;Traction    PT Next Visit Plan continue manual therapy ( trigger point release , IASTYM to upper trap and levator; manual traction and sub-occipital release; begin postural exercises; consider seated series. Patient works Scientist, water quality with Plan of Care Patient           Patient will benefit from skilled therapeutic intervention in order to improve the following deficits and impairments:  Pain,Increased muscle spasms,Postural dysfunction,Decreased range of motion,Decreased mobility,Decreased activity tolerance,Decreased endurance  Visit Diagnosis: Cervicalgia  Other muscle spasm     Problem List Patient Active Problem List   Diagnosis Date Noted  . Hyperthyroidism 10/20/2012  . 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 [place], [place], [address] Phone: [phone]   Fax:  [phone]  Name: Kendra Griffin MRN: [id number] Date of Birth: 1975/11/13

## 2020-08-09 ENCOUNTER — Other Ambulatory Visit: Payer: Self-pay

## 2020-08-09 ENCOUNTER — Ambulatory Visit: Payer: BC Managed Care – PPO | Admitting: Rehabilitative and Restorative Service Providers"

## 2020-08-09 ENCOUNTER — Encounter: Payer: Self-pay | Admitting: Rehabilitative and Restorative Service Providers"

## 2020-08-09 DIAGNOSIS — M542 Cervicalgia: Secondary | ICD-10-CM

## 2020-08-09 DIAGNOSIS — M62838 Other muscle spasm: Secondary | ICD-10-CM | POA: Diagnosis not present
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
## 2020-08-12 ENCOUNTER — Ambulatory Visit: Payer: BC Managed Care – PPO

## 2020-08-13 ENCOUNTER — Ambulatory Visit: Payer: BC Managed Care – PPO | Admitting: Rehabilitative and Restorative Service Providers"
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
## 2020-08-19 ENCOUNTER — Encounter: Payer: Self-pay | Admitting: Family Medicine

## 2020-08-19 ENCOUNTER — Ambulatory Visit (INDEPENDENT_AMBULATORY_CARE_PROVIDER_SITE_OTHER): Payer: BC Managed Care – PPO | Admitting: Family Medicine

## 2020-08-19 ENCOUNTER — Other Ambulatory Visit: Payer: Self-pay

## 2020-08-19 VITALS — BP 142/88 | HR 84 | Ht 66.0 in | Wt 219.0 lb

## 2020-08-19 DIAGNOSIS — M62838 Other muscle spasm: Secondary | ICD-10-CM

## 2020-08-19 DIAGNOSIS — S060X0D Concussion without loss of consciousness, subsequent encounter: Secondary | ICD-10-CM

## 2020-08-19 NOTE — Patient Instructions (Signed)
Thank you for coming in today.  Continue the PT.   Lets give this a month longer.   Recheck in about 1 month.   Let me know if you are having a problem before then.

## 2020-08-20 ENCOUNTER — Ambulatory Visit: Payer: BC Managed Care – PPO | Admitting: Rehabilitative and Restorative Service Providers"

## 2020-08-20 ENCOUNTER — Other Ambulatory Visit: Payer: Self-pay

## 2020-08-20 ENCOUNTER — Encounter: Payer: Self-pay | Admitting: Rehabilitative and Restorative Service Providers"

## 2020-08-20 DIAGNOSIS — M62838 Other muscle spasm: Secondary | ICD-10-CM | POA: Diagnosis not present

## 2020-08-20 DIAGNOSIS — M542 Cervicalgia: Secondary | ICD-10-CM | POA: Diagnosis not present

## 2020-08-20 NOTE — Therapy (Signed)
[place], [place], [address] Phone: [phone]   Fax:  [phone]  Physical Therapy Treatment  Patient Details  Name: Kendra Griffin MRN: [id number] Date of Birth: April 18, 1976 Referring Provider (PT): Dr Lynne Leader   Encounter Date: 08/20/2020   PT End of Session - 08/20/20 1629    Visit Number 4    PT Start Time 0430    PT Stop Time 0923    PT Time Calculation (min) 44 min    Activity Tolerance Patient tolerated treatment well;No increased pain    Behavior During Therapy WFL for tasks assessed/performed           Past Medical History:  Diagnosis Date  . Bacterial vaginitis    Recurrent  . Hernia   . Hypothyroidism   . Stress headaches    work related stress    Past Surgical History:  Procedure Laterality Date  . ABDOMINAL HYSTERECTOMY N/A 04/16/2013   Procedure: HYSTERECTOMY ABDOMINAL;  Surgeon: Anastasio Auerbach, MD;  Location: [facility];  Service: Gynecology;  Laterality: N/A;  . CESAREAN SECTION    . HERNIA REPAIR    . LAPAROSCOPY N/A 04/16/2013   Procedure: LAPAROSCOPY DIAGNOSTIC;  Surgeon: Anastasio Auerbach, MD;  Location: [facility];  Service: Gynecology;  Laterality: N/A;  . MYOMECTOMY      There were no vitals filed for this visit.   Subjective Assessment - 08/20/20 1630    Subjective Today was better but the weekend and yesterday were a 6/10. Nothing would relieve my pain over the weekend. Ibuprofen and rest finally went away on its own. MD wants to do one more month of therapy to reassess.    Currently in Pain? Yes    Pain Score 3     Pain Location Neck    Pain Orientation Right;Left    Pain Descriptors / Indicators Aching;Cramping    Pain Type Chronic pain    Pain Radiating Towards mainly r periscapular area    Pain Onset More than a month ago    Pain Frequency Constant    Multiple Pain Sites No                             [facility] Adult PT  Treatment/Exercise - 08/20/20 0001      Exercises   Exercises Shoulder      Neck Exercises: Seated   Other Seated Exercise UBE level 3 x 5 min post with PT verbal cues for scap retract without Upper Trap substitution      Shoulder Exercises: Supine   Other Supine Exercises RTB bil ER, chest pull, hip pull x 15 each; bil scap protraction stretch 3x15 sec      Shoulder Exercises: Sidelying   Other Sidelying Exercises R scapular protraction stretch sliding hand out on bed x 15 with 2-3 sec hold      Shoulder Exercises: Standing   Other Standing Exercises unilat RTB row, shoulder ext, ER x 15 each performed bil; standing holding onto treadmill lat stretch unilat and bil; standing doorway stretch 2x30 sec      Manual Therapy   Soft tissue mobilization scapular mobs all directions without and without pt movement and with and without sustained hold for extra stretch, subscap release, trigger point rhomboids all performed in L sidelying to R periscapular muscles                  PT Education -  08/20/20 1640    Education Details New HEP issued and discussed it does not replace prior HEP; advised pt she may be sore after today's visit and to heat and stretch as needed and not to do theraband exercises till at least Friday if she is sore; advised her to heat tonight to help with soreness    Person(s) Educated Patient    Methods Explanation;Demonstration    Comprehension Verbalized understanding;Returned demonstration            PT Short Term Goals - 08/20/20 1652      PT SHORT TERM GOAL #1   Title Patient will increase cervical rotation by 20 degrees bilateral    Status On-going      PT SHORT TERM GOAL #2   Title Patient will increase cervical extension by 10 degrees    Status On-going      PT SHORT TERM GOAL #3   Title Patient will increase right UE strength to 5/5    Status On-going      PT SHORT TERM GOAL #4   Title Therapy will review FOTO with the patient    Status  On-going             PT Long Term Goals - 07/24/20 1249      PT LONG TERM GOAL #1   Title Patient will reach behind her head and back without pain    Time 6    Period Weeks    Status New    Target Date 09/04/20      PT LONG TERM GOAL #2   Title Patient will turn her head 70 degrees bilateral to  perfrom work tasks and drive without pain    Time 6    Period Weeks    Status New    Target Date 09/04/20      PT LONG TERM GOAL #3   Title Patient will report no headaches with daily activity    Time 6    Period Weeks    Status New    Target Date 09/04/20                 Plan - 08/20/20 1640    Clinical Impression Statement Pt continues to present to PT with bil periscapular and cervical pain R > L. New HEP issued. She had a RTD appt recently and MD wants her to continue therapy x 1 more month and reassess due to limited PT number. Pt would benefit from further PT for cervical/thoracic flexibility and strengthening, manual therapy and modalities as needed to assist with pain management for neck and periscapular pain. Pt stated she felt looser after treatment and that her R scapular muscles tighten with certain movements; PT advised her to journal when she feels the tightness after the movement to assist [place] with better treating her. She agreed.    PT Treatment/Interventions ADLs/Self Care Home Management;Cryotherapy;Electrical Stimulation;Moist Heat;Ultrasound;Gait training;Stair training;Functional mobility training;Therapeutic activities;Therapeutic exercise;Neuromuscular re-education;Patient/family education;Manual techniques;Passive range of motion;Dry needling;Taping;Traction    PT Next Visit Plan Review HEP and progress periscapular muscle strengthening. continue  manual/US as needed for cervical/upper thoracic pain    PT Home Exercise Plan issued RTB bil shoulder ext, bil shoulder row and corner stretch; stressed to pt no pain with exercises. Access code : [email or web address]. Shoulder  External Rotation and Scapular Retraction with Resistance  REPS: 10 SETS: 1  DAILY: 1 WEEKLY: 7  Doorway Pec Stretch at 90 Degrees Abduction  REPS: 2 SETS: 2  HOLD: 30 SEC  DAILY: 2  WEEKLY: 7  Standing Row with Anchored Resistance  REPS: 10 SETS: 1  DAILY: 1 WEEKLY: 7. PT advised pt to adjust her standing from band with HEP so she can feel the muscle working but no increased pain and that she is not to have an increased pain with any HEP and if she does to discontinue until she returns and we review. PT also discussed not performing theraband TE if too sore or having a painful day.    Consulted and Agree with Plan of Care Patient           Patient will benefit from skilled therapeutic intervention in order to improve the following deficits and impairments:  Pain,Increased muscle spasms,Postural dysfunction,Decreased range of motion,Decreased mobility,Decreased activity tolerance,Decreased endurance  Visit Diagnosis: Other muscle spasm  Cervicalgia     Problem List Patient Active Problem List   Diagnosis Date Noted  . Hyperthyroidism 10/20/2012  . 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 [place], [place], [address] Phone: [phone]   Fax:  [phone]  Name: Kendra Griffin MRN: [id number] Date of Birth: 1976/01/02

## 2020-09-11 ENCOUNTER — Other Ambulatory Visit: Payer: Self-pay

## 2020-09-11 ENCOUNTER — Ambulatory Visit: Payer: BC Managed Care – PPO | Attending: Family Medicine

## 2020-09-11 DIAGNOSIS — M62838 Other muscle spasm: Secondary | ICD-10-CM | POA: Diagnosis not present

## 2020-09-11 DIAGNOSIS — M542 Cervicalgia: Secondary | ICD-10-CM | POA: Diagnosis not present

## 2020-09-11 NOTE — Therapy (Signed)
[place], [place], [address] Phone: [phone]   Fax:  [phone]  Physical Therapy Treatment/Re-evaluation  Patient Details  Name: Kendra Griffin MRN: [id number] Date of Birth: 14-Nov-1975 Referring Provider (PT): Dr Lynne Leader   Encounter Date: 09/11/2020   PT End of Session - 09/11/20 1613    Visit Number 5    Number of Visits 12    Date for PT Re-Evaluation 10/04/20    Authorization Type BCBS - FOTO visit 6 and visit 10    PT Start Time 1615    PT Stop Time 1659    PT Time Calculation (min) 44 min    Activity Tolerance Patient tolerated treatment well;No increased pain    Behavior During Therapy WFL for tasks assessed/performed           Past Medical History:  Diagnosis Date  . Bacterial vaginitis    Recurrent  . Hernia   . Hypothyroidism   . Stress headaches    work related stress    Past Surgical History:  Procedure Laterality Date  . ABDOMINAL HYSTERECTOMY N/A 04/16/2013   Procedure: HYSTERECTOMY ABDOMINAL;  Surgeon: Anastasio Auerbach, MD;  Location: [facility];  Service: Gynecology;  Laterality: N/A;  . CESAREAN SECTION    . HERNIA REPAIR    . LAPAROSCOPY N/A 04/16/2013   Procedure: LAPAROSCOPY DIAGNOSTIC;  Surgeon: Anastasio Auerbach, MD;  Location: [facility];  Service: Gynecology;  Laterality: N/A;  . MYOMECTOMY      There were no vitals filed for this visit.   Subjective Assessment - 09/11/20 1613    Subjective "Any way I move my head, it pops. It's not painful, but it doesn't normally do that. It stings sometimes right on my shoulder blades off and on. Yesterday I had a headache for 16 hours - I took ibuprofen twice, which didn't work. I took exedrin and that worked later."    Pertinent History past history of tension headaches    How long can you sit comfortably? has to sit at work which can cause pain because of what she is doing    How long can you stand comfortably? N/A     How long can you walk comfortably? N/A    Diagnostic tests X-ray: flatening of normal curve    Currently in Pain? Yes    Pain Score 4     Pain Location Neck    Pain Orientation Right;Left    Pain Descriptors / Indicators Sharp   stinging   Pain Type Chronic pain    Pain Onset More than a month ago              [facility] PT Assessment - 09/11/20 0001      Assessment   Medical Diagnosis Cervical Pain    Referring Provider (PT) Dr Lynne Leader    Onset Date/Surgical Date 06/16/20      AROM   Right Shoulder Flexion 156 Degrees    Right Shoulder ABduction 108 Degrees    Right Shoulder Internal Rotation --   T10   Right Shoulder External Rotation --   to occiput   Left Shoulder Flexion 141 Degrees   pain in R shoulder blade reported   Left Shoulder ABduction 128 Degrees    Left Shoulder Internal Rotation --   T9   Left Shoulder External Rotation --   to occiput   Cervical Flexion 54    Cervical Extension 30    Cervical - Right  Side Bend 30    Cervical - Left Side Bend 20    Cervical - Right Rotation 51    Cervical - Left Rotation 41      Strength   Overall Strength Comments "hurts in right shoulder blade"    Right/Left Shoulder Left;Right    Right Shoulder Flexion 4/5    Right Shoulder ABduction 4/5    Right Shoulder Internal Rotation 5/5    Right Shoulder External Rotation 4+/5    Left Shoulder Flexion 4/5    Left Shoulder ABduction 4/5    Left Shoulder Internal Rotation 5/5    Left Shoulder External Rotation 4+/5      Palpation   Palpation comment TTP along R rhomboids and irritation in region with shoulder AROM and MMT                         [facility] Adult PT Treatment/Exercise - 09/11/20 0001      Neck Exercises: Seated   Cervical Rotation Limitations SNAG rotation 5 x 5 sec each direction with pillowcase    Other Seated Exercise UBE L4 x 6 min (76mn forward, 3 min backward)      Shoulder Exercises: Supine   Protraction Strengthening;Both;15  reps;Weights    Protraction Weight (lbs) 2    Horizontal ABduction Strengthening;Both;15 reps;Theraband    Theraband Level (Shoulder Horizontal ABduction) Level 2 (Red)    External Rotation Strengthening;Both;15 reps;Theraband    Theraband Level (Shoulder External Rotation) Level 2 (Red)      Shoulder Exercises: Stretch   Other Shoulder Stretches Doorknob rhomboid stretch x 60 seconds      Manual Therapy   Manual Therapy Joint mobilization    Joint Mobilization Cervical upglides and downglides grades I-III    Soft tissue mobilization STM/MFR/IASTM along R rhomboids and briefly along bilateral upper traps and levator scap    Manual Traction Suboccipital release and gentle cervical distraction                  PT Education - 09/11/20 1721    Education Details Advised to continue choosing interventions from extensive HEP provided previous session within pain-free tolerance. Advised to continue self STM with tennis ball    Person(s) Educated Patient    Methods Explanation;Demonstration    Comprehension Verbalized understanding;Returned demonstration            PT Short Term Goals - 09/11/20 1708      PT SHORT TERM GOAL #1   Title Patient will increase cervical rotation by 20 degrees bilateral    Baseline R rotation improved; L rotation limited (see flowsheet)    Status Partially Met      PT SHORT TERM GOAL #2   Title Patient will increase cervical extension by 10 degrees    Baseline see flowsheet    Status Achieved      PT SHORT TERM GOAL #3   Title Patient will increase right UE strength to 5/5    Baseline see flowsheet    Status On-going      PT SHORT TERM GOAL #4   Title Therapy will review FOTO with the patient    Baseline intake 08/27/2020    Status Partially Met             PT Long Term Goals - 09/11/20 1650      PT LONG TERM GOAL #1   Title Patient will reach behind her head and back without pain    Baseline  sometimes painful and sometimes not     Time 6    Period Weeks    Status Partially Met      PT LONG TERM GOAL #2   Title Patient will turn her head 70 degrees bilateral to  perfrom work tasks and drive without pain    Time 6    Period Weeks    Status On-going      PT LONG TERM GOAL #3   Title Patient will report no headaches with daily activity    Baseline headaches once every week and half - wakes up with them sometimes    Time 6    Period Weeks    Status Partially Met                 Plan - 09/11/20 1614    Clinical Impression Statement MD had advised pt to continue PT for a month during her last follow up, but she had scheduling difficulties and returns today for re-evaluation for the first time since 08/20/2020. During re-evaluation, pt complained of discomfort/pain along right shoulder blade (R rhomboids) during shoulder AROM and MMT assessment with TTP along R rhomboids as well. She has increased cervical AROM with exception of L rotation but continues to have bilateral shoulder FL, ABD, and ER strength deficits and pain with resistance. She expressed relief of discomfort along R rhomboids following STM/MFR/IASTM and demonstrated ability to place bilateral hands behind head without symptoms. She continues to have pain greater with L cervical ROT during SNAG ROT. Will plan to continue skilled PT intervention for a few weeks through 10/04/2020 to address remaining deficits.    Examination-Activity Limitations Carry;Lift;Locomotion Level    Examination-Participation Restrictions Cleaning;Community Activity    Stability/Clinical Decision Making Stable/Uncomplicated    Clinical Decision Making Low    Rehab Potential Excellent    PT Frequency 1x / week    PT Duration 3 weeks    PT Treatment/Interventions ADLs/Self Care Home Management;Cryotherapy;Electrical Stimulation;Moist Heat;Ultrasound;Gait training;Stair training;Functional mobility training;Therapeutic activities;Therapeutic exercise;Neuromuscular  re-education;Patient/family education;Manual techniques;Passive range of motion;Dry needling;Taping;Traction    PT Next Visit Plan Review HEP and progress periscapular muscle strengthening. continue  manual/US as needed for cervical/upper thoracic pain. STM/MFR along R rhomboids    PT Home Exercise Plan issued RTB bil shoulder ext, bil shoulder row and corner stretch; stressed to pt no pain with exercises. Access code : [email or web address]. Shoulder External Rotation and Scapular Retraction with Resistance  REPS: 10 SETS: 1  DAILY: 1 WEEKLY: 7  Doorway Pec Stretch at 90 Degrees Abduction  REPS: 2 SETS: 2  HOLD: 30 SEC DAILY: 2  WEEKLY: 7  Standing Row with Anchored Resistance  REPS: 10 SETS: 1  DAILY: 1 WEEKLY: 7. PT advised pt to adjust her standing from band with HEP so she can feel the muscle working but no increased pain and that she is not to have an increased pain with any HEP and if she does to discontinue until she returns and we review. PT also discussed not performing theraband TE if too sore or having a painful day.    Consulted and Agree with Plan of Care Patient           Patient will benefit from skilled therapeutic intervention in order to improve the following deficits and impairments:  Pain,Increased muscle spasms,Postural dysfunction,Decreased range of motion,Decreased mobility,Decreased activity tolerance,Decreased endurance  Visit Diagnosis: Other muscle spasm  Cervicalgia     Problem List Patient Active Problem List   Diagnosis Date Noted  .  Hyperthyroidism 10/20/2012  . 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

## 2020-09-16 ENCOUNTER — Ambulatory Visit: Payer: BC Managed Care – PPO | Admitting: Family Medicine

## 2020-09-17 ENCOUNTER — Other Ambulatory Visit: Payer: Self-pay

## 2020-09-17 ENCOUNTER — Ambulatory Visit: Payer: BC Managed Care – PPO

## 2020-09-17 DIAGNOSIS — M542 Cervicalgia: Secondary | ICD-10-CM | POA: Diagnosis not present

## 2020-09-17 DIAGNOSIS — M62838 Other muscle spasm: Secondary | ICD-10-CM

## 2020-09-17 NOTE — Therapy (Signed)
[place], [place], [address] Phone: [phone]   Fax:  [phone]  Physical Therapy Treatment  Patient Details  Name: Kendra Griffin MRN: [id number] Date of Birth: 10/29/1975 Referring Provider (PT): Dr Lynne Leader   Encounter Date: 09/17/2020   PT End of Session - 09/17/20 1621    Visit Number 6    Number of Visits 12    Date for PT Re-Evaluation 10/04/20    Authorization Type BCBS    PT Start Time 7867    PT Stop Time 1700    PT Time Calculation (min) 45 min    Activity Tolerance Patient tolerated treatment well;No increased pain    Behavior During Therapy WFL for tasks assessed/performed           Past Medical History:  Diagnosis Date  . Bacterial vaginitis    Recurrent  . Hernia   . Hypothyroidism   . Stress headaches    work related stress    Past Surgical History:  Procedure Laterality Date  . ABDOMINAL HYSTERECTOMY N/A 04/16/2013   Procedure: HYSTERECTOMY ABDOMINAL;  Surgeon: Anastasio Auerbach, MD;  Location: [facility];  Service: Gynecology;  Laterality: N/A;  . CESAREAN SECTION    . HERNIA REPAIR    . LAPAROSCOPY N/A 04/16/2013   Procedure: LAPAROSCOPY DIAGNOSTIC;  Surgeon: Anastasio Auerbach, MD;  Location: [facility];  Service: Gynecology;  Laterality: N/A;  . MYOMECTOMY      There were no vitals filed for this visit.   Subjective Assessment - 09/17/20 1619    Subjective "I had a headache this morning when I woke up, but it wasn't as bad." Pt reports continued popping with cervical AROM in any direction    Pertinent History past history of tension headaches    How long can you sit comfortably? has to sit at work which can cause pain because of what she is doing    How long can you stand comfortably? N/A    How long can you walk comfortably? N/A    Diagnostic tests X-ray: flatening of normal curve    Currently in Pain? Yes    Pain Score 3     Pain Location Neck    Pain  Orientation Right;Left    Pain Descriptors / Indicators Sharp    Pain Type Chronic pain    Pain Onset More than a month ago              [facility] PT Assessment - 09/17/20 0001      Assessment   Medical Diagnosis Cervical Pain    Referring Provider (PT) Dr Lynne Leader    Onset Date/Surgical Date 06/16/20                         [facility] Adult PT Treatment/Exercise - 09/17/20 0001      Neck Exercises: Machines for Strengthening   UBE (Upper Arm Bike) L4 x 6 min (3 min forward, 3 min backward)      Shoulder Exercises: Prone   Other Prone Exercises Thread the needle x 10 each direction. Prone IYT x 10 each direction    Other Prone Exercises Cat/camel with thoracic bias x 20. Bird dog x 10 each UE/LE. Plantigrade push up plus against low mat x 15      Shoulder Exercises: Stretch   Other Shoulder Stretches Doorknob rhomboid stretch x 60 seconds      Manual Therapy   Soft  tissue mobilization STM/MFR/IASTM along L rhomboids and briefly along bilateral upper traps and levator scap    Manual Traction Suboccipital release and gentle cervical distraction      Neck Exercises: Stretches   Upper Trapezius Stretch Right;Left;3 reps;30 seconds    Levator Stretch Right;Left;2 reps;30 seconds                  PT Education - 09/17/20 1715    Education Details Updated and reviewed HEP. Discussed continued stretching before/during/after work and completion of exercises outside of work as able.    Person(s) Educated Patient    Methods Explanation;Demonstration;Handout;Tactile cues;Verbal cues    Comprehension Verbalized understanding;Returned demonstration            PT Short Term Goals - 09/11/20 1708      PT SHORT TERM GOAL #1   Title Patient will increase cervical rotation by 20 degrees bilateral    Baseline R rotation improved; L rotation limited (see flowsheet)    Status Partially Met      PT SHORT TERM GOAL #2   Title Patient will increase cervical extension  by 10 degrees    Baseline see flowsheet    Status Achieved      PT SHORT TERM GOAL #3   Title Patient will increase right UE strength to 5/5    Baseline see flowsheet    Status On-going      PT SHORT TERM GOAL #4   Title Therapy will review FOTO with the patient    Baseline intake 08/27/2020    Status Partially Met             PT Long Term Goals - 09/11/20 1650      PT LONG TERM GOAL #1   Title Patient will reach behind her head and back without pain    Baseline sometimes painful and sometimes not    Time 6    Period Weeks    Status Partially Met      PT LONG TERM GOAL #2   Title Patient will turn her head 70 degrees bilateral to  perfrom work tasks and drive without pain    Time 6    Period Weeks    Status On-going      PT LONG TERM GOAL #3   Title Patient will report no headaches with daily activity    Baseline headaches once every week and half - wakes up with them sometimes    Time 6    Period Weeks    Status Partially Met                 Plan - 09/17/20 1622    Clinical Impression Statement Patient presents with aggravation of L rhomboid that is more apparent with R cervical rotation upon arrival. She continues to experience popping with cervical AROM "like popping my knuckles" that is not painful. Patient tolerated session well with fatigue but no significant pinching or pain in periscapular musculature or cervical spine. She continues to feel "tightness" during R shoulder AROM. She did well with addition of quadruped interventions for thoracic mobility as well as core, UE, and hip muscle activation. She should benefit from continued skilled PT intervention to allow for pain reduction/management and improved tolerance with daily activities.    Examination-Activity Limitations Carry;Lift;Locomotion Level    Examination-Participation Restrictions Cleaning;Community Activity    Stability/Clinical Decision Making Stable/Uncomplicated    Rehab Potential Excellent     PT Frequency 1x / week    PT  Duration 3 weeks    PT Treatment/Interventions ADLs/Self Care Home Management;Cryotherapy;Electrical Stimulation;Moist Heat;Ultrasound;Gait training;Stair training;Functional mobility training;Therapeutic activities;Therapeutic exercise;Neuromuscular re-education;Patient/family education;Manual techniques;Passive range of motion;Dry needling;Taping;Traction    PT Next Visit Plan Review HEP and progress periscapular muscle strengthening. continue  manual/US as needed for cervical/upper thoracic pain. STM/MFR along rhomboids    PT Home Exercise Plan [email or web address]    Consulted and Agree with Plan of Care Patient           Patient will benefit from skilled therapeutic intervention in order to improve the following deficits and impairments:  Pain,Increased muscle spasms,Postural dysfunction,Decreased range of motion,Decreased mobility,Decreased activity tolerance,Decreased endurance  Visit Diagnosis: Other muscle spasm  Cervicalgia     Problem List Patient Active Problem List   Diagnosis Date Noted  . Hyperthyroidism 10/20/2012  . 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 [place], [place], [address] Phone: [phone]   Fax:  [phone]  Name: Kendra Griffin MRN: [id number] Date of Birth: 1976/04/01

## 2020-09-24 ENCOUNTER — Other Ambulatory Visit: Payer: Self-pay

## 2020-09-24 ENCOUNTER — Ambulatory Visit: Payer: BC Managed Care – PPO | Attending: Family Medicine | Admitting: Rehabilitative and Restorative Service Providers"

## 2020-09-24 ENCOUNTER — Ambulatory Visit: Payer: BC Managed Care – PPO

## 2020-09-24 ENCOUNTER — Encounter: Payer: Self-pay | Admitting: Rehabilitative and Restorative Service Providers"

## 2020-09-24 DIAGNOSIS — M62838 Other muscle spasm: Secondary | ICD-10-CM | POA: Diagnosis not present

## 2020-09-24 DIAGNOSIS — M542 Cervicalgia: Secondary | ICD-10-CM | POA: Insufficient documentation

## 2020-09-24 NOTE — Therapy (Signed)
[place], [place], [address] Phone: [phone]   Fax:  [phone]  Physical Therapy Treatment  Patient Details  Name: Kendra Griffin MRN: [id number] Date of Birth: 01-04-1976 Referring Provider (PT): Dr Lynne Leader   Encounter Date: 09/24/2020   PT End of Session - 09/24/20 1725    Visit Number 7    PT Start Time 0432    PT Stop Time 0518    PT Time Calculation (min) 46 min    Activity Tolerance Patient tolerated treatment well;No increased pain    Behavior During Therapy WFL for tasks assessed/performed           Past Medical History:  Diagnosis Date  . Bacterial vaginitis    Recurrent  . Hernia   . Hypothyroidism   . Stress headaches    work related stress    Past Surgical History:  Procedure Laterality Date  . ABDOMINAL HYSTERECTOMY N/A 04/16/2013   Procedure: HYSTERECTOMY ABDOMINAL;  Surgeon: Anastasio Auerbach, MD;  Location: [facility];  Service: Gynecology;  Laterality: N/A;  . CESAREAN SECTION    . HERNIA REPAIR    . LAPAROSCOPY N/A 04/16/2013   Procedure: LAPAROSCOPY DIAGNOSTIC;  Surgeon: Anastasio Auerbach, MD;  Location: [facility];  Service: Gynecology;  Laterality: N/A;  . MYOMECTOMY      There were no vitals filed for this visit.   Subjective Assessment - 09/24/20 1636    Subjective I don't want to do what I did last time. My pain was about a 7/10 from last treatment through Sunday and then started to come down. was around my shoulder blades. Now it is better around a 3-4/10. I felt fine during the exercises but by the time I got home, I was very uncomfortable.    Currently in Pain? Yes    Pain Score 4     Pain Location Scapula    Pain Orientation Right;Left    Pain Descriptors / Indicators Tightness    Pain Type Chronic pain    Pain Onset More than a month ago    Pain Frequency Constant    Multiple Pain Sites No                             [facility] Adult PT  Treatment/Exercise - 09/24/20 0001      Shoulder Exercises: Supine   Other Supine Exercises 3 lb protraction/retraction exercise unilat bil x 20 each; 3 lb iso protraction with shoulder circles limited ROM x 20 CW/CCW each; scapular depression performed bil but pt unable to activate R side properly; therefore R scapula depressors facilitated first and then left to complete the rep and pt able to perform x 20 properly      Shoulder Exercises: Seated   Other Seated Exercises UBE 3 min forward, 2 min posterior with PT  monitoring for posture      Shoulder Exercises: Prone   Other Prone Exercises shoulder ext, scaption, horiz abduction x 15 each performed unilat bil; alternating arm row in quadriped x 15 each; quadriped scapular protraction and retraction x 20 with PT tactile and verbal cues for technique      Shoulder Exercises: Standing   Other Standing Exercises Freemotion Lat pulldown unilat 3 lbs x 15 unilat bil; unilat bil free motion row 7 lbs x 15 each side; wall pushups x 20; yellowband PNF D2 pattern  PT Education - 09/24/20 1724    Education Details Reviewed exercises with patient; she states she is really only doing 4 exercises at home. PT/pt streamlined them: doorway pec stretch, Upper Trap/Levator stretch, baby eagle stretch if she is running short on time. she is heating as well at home.    Person(s) Educated Patient    Methods Explanation;Demonstration    Comprehension Verbalized understanding;Returned demonstration            PT Short Term Goals - 09/24/20 1732      PT SHORT TERM GOAL #1   Title Patient will increase cervical rotation by 20 degrees bilateral    Status Partially Met      PT SHORT TERM GOAL #2   Title Patient will increase cervical extension by 10 degrees    Status Achieved      PT SHORT TERM GOAL #3   Title Patient will increase right UE strength to 5/5    Status On-going      PT SHORT TERM GOAL #4   Title Therapy will  review FOTO with the patient    Status Partially Met             PT Long Term Goals - 09/11/20 1650      PT LONG TERM GOAL #1   Title Patient will reach behind her head and back without pain    Baseline sometimes painful and sometimes not    Time 6    Period Weeks    Status Partially Met      PT LONG TERM GOAL #2   Title Patient will turn her head 70 degrees bilateral to  perfrom work tasks and drive without pain    Time 6    Period Weeks    Status On-going      PT LONG TERM GOAL #3   Title Patient will report no headaches with daily activity    Baseline headaches once every week and half - wakes up with them sometimes    Time 6    Period Weeks    Status Partially Met                 Plan - 09/24/20 1726    Clinical Impression Statement Pt presents to PT with continued complaints of bil periscapular pain R > L. Pt has decreased muscle activation on R side of Rhomboids and Lats. With proper facilitation, she can facilitate those muscles with cueing and recruiting those fibers first on R and then on L. Pt would benefit from further PT for scapular strengthening and flexibility activities as tolerated. Manual PT and modalities to be used as needed. Pt reports no increased pain after tx.    PT Treatment/Interventions ADLs/Self Care Home Management;Cryotherapy;Electrical Stimulation;Moist Heat;Ultrasound;Gait training;Stair training;Functional mobility training;Therapeutic activities;Therapeutic exercise;Neuromuscular re-education;Patient/family education;Manual techniques;Passive range of motion;Dry needling;Taping;Traction    PT Next Visit Plan progress periscapular muscle strengthening. continue  manual/US as needed for cervical/upper thoracic pain. STM/MFR along rhomboids PRN    Consulted and Agree with Plan of Care Patient           Patient will benefit from skilled therapeutic intervention in order to improve the following deficits and impairments:  Pain,Increased  muscle spasms,Postural dysfunction,Decreased range of motion,Decreased mobility,Decreased activity tolerance,Decreased endurance  Visit Diagnosis: Cervicalgia  Other muscle spasm     Problem List Patient Active Problem List   Diagnosis Date Noted  . Hyperthyroidism 10/20/2012  . 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

## 2020-10-01 ENCOUNTER — Ambulatory Visit: Payer: BC Managed Care – PPO

## 2020-10-01 ENCOUNTER — Other Ambulatory Visit: Payer: Self-pay

## 2020-10-01 DIAGNOSIS — M542 Cervicalgia: Secondary | ICD-10-CM | POA: Diagnosis not present

## 2020-10-01 DIAGNOSIS — M62838 Other muscle spasm: Secondary | ICD-10-CM

## 2020-10-01 NOTE — Therapy (Addendum)
[place], [place], [address] Phone: [phone]   Fax:  [phone]  Physical Therapy Treatment/Re-evaluation/ Hold formal PT 2-3 weeks/Discharge  Patient Details  Name: Kendra Griffin MRN: [id number] Date of Birth: 1975/12/19 Referring Provider (PT): Dr Lynne Leader   Encounter Date: 10/01/2020   PT End of Session - 10/01/20 1619    Visit Number 8    Number of Visits 12    Date for PT Re-Evaluation 10/25/20    Authorization Type BCBS    PT Start Time 1620   pt arrived late   PT Stop Time 1658    PT Time Calculation (min) 38 min    Activity Tolerance Patient tolerated treatment well;No increased pain    Behavior During Therapy WFL for tasks assessed/performed           Past Medical History:  Diagnosis Date  . Bacterial vaginitis    Recurrent  . Hernia   . Hypothyroidism   . Stress headaches    work related stress    Past Surgical History:  Procedure Laterality Date  . ABDOMINAL HYSTERECTOMY N/A 04/16/2013   Procedure: HYSTERECTOMY ABDOMINAL;  Surgeon: Anastasio Auerbach, MD;  Location: [facility];  Service: Gynecology;  Laterality: N/A;  . CESAREAN SECTION    . HERNIA REPAIR    . LAPAROSCOPY N/A 04/16/2013   Procedure: LAPAROSCOPY DIAGNOSTIC;  Surgeon: Anastasio Auerbach, MD;  Location: [facility];  Service: Gynecology;  Laterality: N/A;  . MYOMECTOMY      There were no vitals filed for this visit.   Subjective Assessment - 10/01/20 1618    Subjective "The past 4-5 days have felt almost normal. I feel better and can still feel the tightness but not really pain. I am still having the popping just like as if I'm popping my knuckles."    Pertinent History past history of tension headaches    How long can you sit comfortably? has to sit at work which can cause pain because of what she is doing    How long can you stand comfortably? N/A    How long can you walk comfortably? N/A    Diagnostic tests  X-ray: flatening of normal curve    Currently in Pain? No/denies    Pain Score 0-No pain    Pain Onset More than a month ago              [facility] PT Assessment - 10/01/20 0001      Assessment   Medical Diagnosis Cervical Pain    Referring Provider (PT) Dr Lynne Leader    Onset Date/Surgical Date 06/16/20      AROM   Right Shoulder Flexion 145 Degrees    Right Shoulder ABduction 128 Degrees    Right Shoulder Internal Rotation --   T10   Right Shoulder External Rotation --   C7-T1   Left Shoulder Flexion 142 Degrees    Left Shoulder ABduction 128 Degrees    Left Shoulder Internal Rotation --   T10   Left Shoulder External Rotation --   C7-T1   Cervical Flexion 55    Cervical Extension 40    Cervical - Right Side Bend 40    Cervical - Left Side Bend 35    Cervical - Right Rotation 50    Cervical - Left Rotation 50      Strength   Overall Strength Comments BUE 5/5 grossly with no pain during resistance  [facility] Adult PT Treatment/Exercise - 10/01/20 0001      Neck Exercises: Machines for Strengthening   UBE (Upper Arm Bike) L4 x 6 min (3 min forward, 3 min backward)      Shoulder Exercises: Supine   Horizontal ABduction Strengthening;Both;15 reps;Theraband    Theraband Level (Shoulder Horizontal ABduction) Level 2 (Red)    Other Supine Exercises 3 lb protraction/retraction exercise unilat bil x 20 each; 3 lb iso protraction with shoulder circles limited ROM x 20 CW/CCW each;      Shoulder Exercises: Prone   Other Prone Exercises prone IYT x 15 each direction then EXT x 15    Other Prone Exercises Cat/camel with thoracic bias x 20                  PT Education - 10/01/20 1949    Education Details Reviewed final HEP and discussed holding formal PT for 2-3 weeks to assess patient's response to performing HEP independently    Person(s) Educated Patient    Methods Explanation;Demonstration    Comprehension Verbalized  understanding;Returned demonstration            PT Short Term Goals - 10/01/20 1649      PT SHORT TERM GOAL #1   Title Patient will increase cervical rotation by 20 degrees bilateral    Baseline see flosheet    Status Partially Met      PT SHORT TERM GOAL #2   Title Patient will increase cervical extension by 10 degrees    Baseline see flowsheet    Status Achieved      PT SHORT TERM GOAL #3   Title Patient will increase right UE strength to 5/5    Baseline see flowsheet    Status Achieved      PT SHORT TERM GOAL #4   Title Therapy will review FOTO with the patient    Status Partially Met             PT Long Term Goals - 10/01/20 1650      PT LONG TERM GOAL #1   Title Patient will reach behind her head and back without pain    Baseline has not tried frequently but was able to do without pain during today's session    Time 6    Period Weeks    Status Achieved      PT LONG TERM GOAL #2   Title Patient will turn her head 70 degrees bilateral to  perfrom work tasks and drive without pain    Baseline see flowsheet    Time 6    Period Weeks    Status Partially Met      PT LONG TERM GOAL #3   Title Patient will report no headaches with daily activity    Baseline headaches once every week and half - wakes up with them sometimes    Time 6    Period Weeks    Status Partially Met                 Plan - 10/01/20 1619    Clinical Impression Statement Patient tolerated session well with no adverse effects or complaints of increased pain this session. She was able to perform prone IYT and shoulder EXT with improved muscle facilitation this session. She verbalizes compliance with HEP and expresses that she will plan to continue performing HEP independently and assess response over next 2-3 weeks without formal PT. Pt has a follow up appointment with physician next  week and will plan to call and schedule PT appointment if symptoms worsen or are aggravated over next few  weeks. Informed pt that we will plan to D/C if she continues to do well and does not schedule appointment by the end of the month and that she would require new referral once D/C if she returns to PT.    Examination-Activity Limitations Carry;Lift;Locomotion Level    Examination-Participation Restrictions Cleaning;Community Activity    PT Treatment/Interventions ADLs/Self Care Home Management;Cryotherapy;Electrical Stimulation;Moist Heat;Ultrasound;Gait training;Stair training;Functional mobility training;Therapeutic activities;Therapeutic exercise;Neuromuscular re-education;Patient/family education;Manual techniques;Passive range of motion;Dry needling;Taping;Traction    PT Next Visit Plan hold PT 2-3 weeks then D/C if patient does not schedule appointment. progress periscapular muscle strengthening. continue manual/US as needed for cervical/upper thoracic pain. STM/MFR along rhomboids PRN    PT Home Exercise Plan [id number]    Consulted and Agree with Plan of Care Patient           Patient will benefit from skilled therapeutic intervention in order to improve the following deficits and impairments:  Pain,Increased muscle spasms,Postural dysfunction,Decreased range of motion,Decreased mobility,Decreased activity tolerance,Decreased endurance  Visit Diagnosis: Cervicalgia  Other muscle spasm     Problem List Patient Active Problem List   Diagnosis Date Noted  . Hyperthyroidism 10/20/2012  . 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  Patient goals were partially met. Patient is being discharged due to being pleased with the current functional level.  ?????    Planned to hold for 2-3 weeks and allow patient to schedule until 10/22/2020 and discharge  otherwise. Attempted to call patient to follow up and left voicemail that we will go ahead and discharge at this time, but she may obtain new PT referral from doctor if she decides to.   Haydee Monica, PT, DPT 10/23/20 7:29 PM  [facility] [address] [place], [place], [address] Phone: [phone]   Fax:  [phone]  Name: ALYRICA THUROW MRN: [id number] Date of Birth: 07/14/1976

## 2020-10-08 ENCOUNTER — Ambulatory Visit (INDEPENDENT_AMBULATORY_CARE_PROVIDER_SITE_OTHER): Payer: BC Managed Care – PPO | Admitting: Family Medicine

## 2020-10-08 ENCOUNTER — Other Ambulatory Visit: Payer: Self-pay

## 2020-10-08 VITALS — BP 165/100 | HR 76 | Ht 66.0 in | Wt 219.6 lb

## 2020-10-08 DIAGNOSIS — S060X0D Concussion without loss of consciousness, subsequent encounter: Secondary | ICD-10-CM | POA: Diagnosis not present

## 2020-10-08 MED ORDER — TIZANIDINE HCL 4 MG PO TABS: 4.0000 mg | ORAL_TABLET | Freq: Four times a day (QID) | ORAL | 1 refills | Status: DC | PRN

## 2020-10-08 NOTE — Patient Instructions (Signed)
Thank you for coming in today.  Recheck as needed.   Let me know if you need something in the future.   PT could be done again if needed.
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
## 2020-11-11 ENCOUNTER — Ambulatory Visit (INDEPENDENT_AMBULATORY_CARE_PROVIDER_SITE_OTHER): Payer: BC Managed Care – PPO | Admitting: Nurse Practitioner

## 2020-11-11 ENCOUNTER — Encounter: Payer: Self-pay | Admitting: Nurse Practitioner

## 2020-11-11 ENCOUNTER — Other Ambulatory Visit: Payer: Self-pay

## 2020-11-11 VITALS — BP 150/90

## 2020-11-11 DIAGNOSIS — Z113 Encounter for screening for infections with a predominantly sexual mode of transmission: Secondary | ICD-10-CM | POA: Diagnosis not present

## 2020-11-11 NOTE — Progress Notes (Signed)
   Acute Office Visit  Subjective:    Patient ID: Kendra Griffin, female    DOB: [birth year], 45 y.o.   MRN: [id number]   HPI 45 y.o. presents today for STD screening. Unsure of fiance's fidelity. Denies any vaginal symptoms.    Review of Systems  Constitutional: Negative.   Genitourinary: Negative.        Objective:    Physical Exam Constitutional:      Appearance: Normal appearance.  Genitourinary:    General: Normal vulva.     Vagina: Normal.     Uterus: Absent.      BP (!) 150/90   LMP 01/04/2013  Wt Readings from Last 3 Encounters:  10/08/20 219 lb 9.6 oz (99.6 kg)  08/19/20 219 lb (99.3 kg)  07/16/20 214 lb 9.6 oz (97.3 kg)        Assessment & Plan:   Problem List Items Addressed This Visit   None   Visit Diagnoses    Screen for STD (sexually transmitted disease)    -  Primary   Relevant Orders   SURESWAB CT/NG/T. vaginalis   RPR   HIV Antibody (routine testing w rflx)     Plan: STD panel pending, will triage based on results.      Tamela Gammon DNP, 4:01 PM 11/11/2020

## 2020-11-12 ENCOUNTER — Other Ambulatory Visit: Payer: Self-pay | Admitting: Nurse Practitioner

## 2020-11-12 DIAGNOSIS — A599 Trichomoniasis, unspecified: Secondary | ICD-10-CM

## 2020-11-12 LAB — RPR: RPR Ser Ql: NONREACTIVE

## 2020-11-12 LAB — HIV ANTIBODY (ROUTINE TESTING W REFLEX): HIV 1&2 Ab, 4th Generation: NONREACTIVE

## 2020-11-12 LAB — SURESWAB CT/NG/T. VAGINALIS
C. trachomatis RNA, TMA: NOT DETECTED
N. gonorrhoeae RNA, TMA: NOT DETECTED
Trichomonas vaginalis RNA: DETECTED — AB

## 2020-11-12 MED ORDER — METRONIDAZOLE 500 MG PO TABS: 500.0000 mg | ORAL_TABLET | Freq: Two times a day (BID) | ORAL | 0 refills | Status: DC

## 2021-02-18 ENCOUNTER — Other Ambulatory Visit: Payer: Self-pay | Admitting: Family Medicine

## 2021-02-19 NOTE — Telephone Encounter (Signed)
Rx refill request approved per Dr. Corey's orders. 

## 2021-03-09 ENCOUNTER — Encounter: Payer: Self-pay | Admitting: Nurse Practitioner

## 2021-03-09 ENCOUNTER — Other Ambulatory Visit: Payer: Self-pay

## 2021-03-09 ENCOUNTER — Ambulatory Visit (INDEPENDENT_AMBULATORY_CARE_PROVIDER_SITE_OTHER): Payer: BC Managed Care – PPO | Admitting: Nurse Practitioner

## 2021-03-09 VITALS — BP 120/76 | Ht 65.5 in | Wt 224.0 lb

## 2021-03-09 DIAGNOSIS — Z01419 Encounter for gynecological examination (general) (routine) without abnormal findings: Secondary | ICD-10-CM | POA: Diagnosis not present

## 2021-03-09 DIAGNOSIS — E785 Hyperlipidemia, unspecified: Secondary | ICD-10-CM | POA: Diagnosis not present

## 2021-03-09 DIAGNOSIS — Z113 Encounter for screening for infections with a predominantly sexual mode of transmission: Secondary | ICD-10-CM

## 2021-03-09 DIAGNOSIS — Z90710 Acquired absence of both cervix and uterus: Secondary | ICD-10-CM | POA: Insufficient documentation

## 2021-03-09 LAB — COMPREHENSIVE METABOLIC PANEL
AG Ratio: 1.6 (calc) (ref 1.0–2.5)
ALT: 11 U/L (ref 6–29)
AST: 11 U/L (ref 10–30)
Albumin: 3.9 g/dL (ref 3.6–5.1)
Alkaline phosphatase (APISO): 56 U/L (ref 31–125)
BUN: 10 mg/dL (ref 7–25)
CO2: 29 mmol/L (ref 20–32)
Calcium: 8.9 mg/dL (ref 8.6–10.2)
Chloride: 104 mmol/L (ref 98–110)
Creat: 0.76 mg/dL (ref 0.50–0.99)
Globulin: 2.5 g/dL (calc) (ref 1.9–3.7)
Glucose, Bld: 94 mg/dL (ref 65–99)
Potassium: 3.5 mmol/L (ref 3.5–5.3)
Sodium: 139 mmol/L (ref 135–146)
Total Bilirubin: 0.4 mg/dL (ref 0.2–1.2)
Total Protein: 6.4 g/dL (ref 6.1–8.1)

## 2021-03-09 LAB — CBC WITH DIFFERENTIAL/PLATELET
Absolute Monocytes: 339 cells/uL (ref 200–950)
Basophils Absolute: 21 cells/uL (ref 0–200)
Basophils Relative: 0.4 %
Eosinophils Absolute: 101 cells/uL (ref 15–500)
Eosinophils Relative: 1.9 %
HCT: 36.6 % (ref 35.0–45.0)
Hemoglobin: 12.4 g/dL (ref 11.7–15.5)
Lymphs Abs: 1712 cells/uL (ref 850–3900)
MCH: 29.7 pg (ref 27.0–33.0)
MCHC: 33.9 g/dL (ref 32.0–36.0)
MCV: 87.8 fL (ref 80.0–100.0)
MPV: 10.3 fL (ref 7.5–12.5)
Monocytes Relative: 6.4 %
Neutro Abs: 3127 cells/uL (ref 1500–7800)
Neutrophils Relative %: 59 %
Platelets: 200 10*3/uL (ref 140–400)
RBC: 4.17 10*6/uL (ref 3.80–5.10)
RDW: 13.6 % (ref 11.0–15.0)
Total Lymphocyte: 32.3 %
WBC: 5.3 10*3/uL (ref 3.8–10.8)

## 2021-03-09 LAB — LIPID PANEL
Cholesterol: 227 mg/dL — ABNORMAL HIGH (ref <200)
HDL: 50 mg/dL (ref >=50)
LDL Cholesterol (Calc): 155 mg/dL (calc) — ABNORMAL HIGH
Non-HDL Cholesterol (Calc): 177 mg/dL (calc) — ABNORMAL HIGH (ref <130)
Total CHOL/HDL Ratio: 4.5 (calc) (ref <5.0)
Triglycerides: 105 mg/dL (ref <150)

## 2021-03-09 NOTE — Progress Notes (Signed)
   Kendra Griffin 1976-05-24 [id number]   History:  45 y.o. [id number] presents for annual exam without GYN complaints. 2014 TAH for fibroids. Normal pap history. HSV, rare outbreaks. History of recurrent BV, Metrogel as needed. Hyperthyroidism managed by endocrinology. Trichomonas infection 10/2020.   Gynecologic History Patient's last menstrual period was 01/04/2013.   Contraception: status post hysterectomy  Health Maintenance Last Pap: 2014. Results were: normal Last mammogram: 03/16/2019. Results were: normal Last colonoscopy: Not indicated Last Dexa: Not indicated  Past medical history, past surgical history, family history and social history were all reviewed and documented in the EPIC chart. Engaged. 2 daughters ages 44 and 32.   ROS:  A ROS was performed and pertinent positives and negatives are included.  Exam:  Vitals:   03/09/21 0829  BP: 120/76  Weight: 224 lb (101.6 kg)  Height: 5' 5.5" (1.664 m)    Body mass index is 36.71 kg/m.  General appearance:  Normal Thyroid:  Symmetrical, normal in size, without palpable masses or nodularity. Respiratory  Auscultation:  Clear without wheezing or rhonchi Cardiovascular  Auscultation:  Regular rate, without rubs, murmurs or gallops  Edema/varicosities:  Not grossly evident Abdominal  Soft,nontender, without masses, guarding or rebound.  Liver/spleen:  No organomegaly noted  Hernia:  None appreciated  Skin  Inspection:  Grossly normal   Breasts: Examined lying and sitting.   Right: Without masses, retractions, discharge or axillary adenopathy.   Left: Without masses, retractions, discharge or axillary adenopathy. Gentitourinary   Inguinal/mons:  Normal without inguinal adenopathy  External genitalia:  Normal  BUS/Urethra/Skene's glands:  Normal  Vagina:  Normal  Cervix:  Absent  Uterus:  Absent  Adnexa/parametria:     Rt: Without masses or tenderness.   Lt: Without masses or tenderness.  Anus and  perineum: Normal  Digital rectal exam: Normal sphincter tone without palpated masses or tenderness  Assessment/Plan:  45 y.o. [id number] for annual exam.   Well female exam with routine gynecological exam - Plan: CBC with Differential/Platelet, Comprehensive metabolic panel. Education provided on SBEs, importance of preventative screenings, current guidelines, high calcium diet, regular exercise, and multivitamin daily. Plans to establish with PCP.   History of total abdominal hysterectomy - 2014 for fibroids  Hyperlipidemia, unspecified hyperlipidemia type - Plan: Lipid panel  Screen for STD (sexually transmitted disease) - Plan: SURESWAB CT/NG/T. Vaginalis. + trich 10/2020.   Screening for cervical cancer - Normal Pap history. No longer screening per guidelines.   Screening for breast cancer - Normal mammogram history. Overdue and encouraged to schedule soon. Normal breast exam today.  Screening for colon cancer - Will plan for colonoscopy at age 53.   Follow up in 1 year for annual     [place], 8:52 AM 03/09/2021

## 2021-03-10 ENCOUNTER — Other Ambulatory Visit: Payer: Self-pay | Admitting: Nurse Practitioner

## 2021-03-10 DIAGNOSIS — A599 Trichomoniasis, unspecified: Secondary | ICD-10-CM

## 2021-03-10 LAB — SURESWAB CT/NG/T. VAGINALIS
C. trachomatis RNA, TMA: NOT DETECTED
N. gonorrhoeae RNA, TMA: NOT DETECTED
Trichomonas vaginalis RNA: NOT DETECTED

## 2021-03-10 MED ORDER — METRONIDAZOLE 500 MG PO TABS: 500.0000 mg | ORAL_TABLET | Freq: Two times a day (BID) | ORAL | 0 refills | Status: DC

## 2021-04-30 DIAGNOSIS — E890 Postprocedural hypothyroidism: Secondary | ICD-10-CM | POA: Diagnosis not present

## 2021-05-07 DIAGNOSIS — I10 Essential (primary) hypertension: Secondary | ICD-10-CM | POA: Diagnosis not present

## 2021-05-07 DIAGNOSIS — E890 Postprocedural hypothyroidism: Secondary | ICD-10-CM | POA: Diagnosis not present

## 2021-05-07 DIAGNOSIS — E669 Obesity, unspecified: Secondary | ICD-10-CM | POA: Diagnosis not present

## 2021-05-26 ENCOUNTER — Ambulatory Visit: Payer: BC Managed Care – PPO | Admitting: Podiatry

## 2021-06-01 ENCOUNTER — Ambulatory Visit (INDEPENDENT_AMBULATORY_CARE_PROVIDER_SITE_OTHER): Payer: BC Managed Care – PPO

## 2021-06-01 ENCOUNTER — Other Ambulatory Visit: Payer: Self-pay

## 2021-06-01 ENCOUNTER — Encounter: Payer: Self-pay | Admitting: Podiatry

## 2021-06-01 ENCOUNTER — Ambulatory Visit (INDEPENDENT_AMBULATORY_CARE_PROVIDER_SITE_OTHER): Payer: BC Managed Care – PPO | Admitting: Podiatry

## 2021-06-01 DIAGNOSIS — M79672 Pain in left foot: Secondary | ICD-10-CM | POA: Diagnosis not present

## 2021-06-01 DIAGNOSIS — M722 Plantar fascial fibromatosis: Secondary | ICD-10-CM | POA: Diagnosis not present

## 2021-06-01 DIAGNOSIS — M79671 Pain in right foot: Secondary | ICD-10-CM

## 2021-06-01 MED ORDER — DICLOFENAC SODIUM 75 MG PO TBEC: 75.0000 mg | DELAYED_RELEASE_TABLET | Freq: Two times a day (BID) | ORAL | 2 refills | Status: DC

## 2021-06-01 MED ORDER — TRIAMCINOLONE ACETONIDE 10 MG/ML IJ SUSP: 10.0000 mg | Freq: Once | INTRAMUSCULAR | Status: DC

## 2021-06-01 NOTE — Patient Instructions (Signed)

## 2021-06-03 ENCOUNTER — Other Ambulatory Visit: Payer: Self-pay | Admitting: Podiatry

## 2021-06-03 DIAGNOSIS — M722 Plantar fascial fibromatosis: Secondary | ICD-10-CM

## 2021-06-03 NOTE — Progress Notes (Signed)
Subjective:   Patient ID: Kendra Griffin, female   DOB: 45 y.o.   MRN: [id number]   HPI Patient states she has had a lot of pain on the bottom of both feet and states that it is very sore when she tries to walk on it.  States that the right is worse than the left but they are both bothersome for her and gradually getting worse and its been going on for a number of months with not knowing exact amount of time.  Patient does not smoke likes to be active   Review of Systems  All other systems reviewed and are negative.      Objective:  Physical Exam Vitals and nursing note reviewed.  Constitutional:      Appearance: She is well-developed.  Pulmonary:     Effort: Pulmonary effort is normal.  Musculoskeletal:        General: Normal range of motion.  Skin:    General: Skin is warm.  Neurological:     Mental Status: She is alert.    Neurovascular status intact muscle strength found to be adequate range of motion within normal limits.  Patient is noted to have acute discomfort plantar aspect heel region right over left with inflammation fluid of the medial band.  Patient is found to have good digital perfusion well oriented x3 with moderate depression of the arch     Assessment:  Acute Planter fasciitis bilateral with inflammation fluid around the medial band     Plan:  H&P reviewed condition sterile prep and injected the fascia bilateral 3 mg Kenalog 5 mg Xylocaine and applied fascial brace right.  Patient will begin physical therapy anti-inflammatories shoe gear modification and will be seen back to recheck and is placed on diclofenac  X-rays indicate minimal spur formation no indication stress fracture arthritis associated with condition signed this

## 2021-06-12 ENCOUNTER — Ambulatory Visit: Payer: BC Managed Care – PPO | Admitting: Obstetrics and Gynecology

## 2021-06-22 ENCOUNTER — Encounter: Payer: Self-pay | Admitting: Podiatry

## 2021-06-22 ENCOUNTER — Ambulatory Visit (INDEPENDENT_AMBULATORY_CARE_PROVIDER_SITE_OTHER): Payer: BC Managed Care – PPO | Admitting: Podiatry

## 2021-06-22 ENCOUNTER — Other Ambulatory Visit: Payer: Self-pay

## 2021-06-22 DIAGNOSIS — M722 Plantar fascial fibromatosis: Secondary | ICD-10-CM

## 2021-06-24 NOTE — Progress Notes (Signed)
Subjective:   Patient ID: Kendra Griffin, female   DOB: 45 y.o.   MRN: [id number]   HPI Patient presents stating the heels are moderately improved still having pain but better than it was previously.  Patient is still not walking the way she wants but she is not having the severe pain that she was having previously   ROS      Objective:  Physical Exam  Neurovascular status intact moderate flatfoot deformity noted bilateral with chronic structural issues moderate bunion deformity severe discomfort that has been alleviated with moderate discomfort still in the plantar heels bilateral     Assessment:  Acute fasciitis moderately improved with patient who has mechanical dysfunction bilateral     Plan:  H&P reviewed condition and at this point continue brace usage which been effective along with stretching and shoe gear modification and have recommended long-term orthotics and patient is casted for functional orthotics to lift the arch up and take pressure off her feet.  Patient will be seen back when orthotics are returned and is encouraged questions concerns prior to that event

## 2021-07-07 ENCOUNTER — Other Ambulatory Visit: Payer: Self-pay

## 2021-07-07 ENCOUNTER — Ambulatory Visit (INDEPENDENT_AMBULATORY_CARE_PROVIDER_SITE_OTHER): Payer: BC Managed Care – PPO | Admitting: Obstetrics & Gynecology

## 2021-07-07 ENCOUNTER — Encounter: Payer: Self-pay | Admitting: Obstetrics & Gynecology

## 2021-07-07 VITALS — BP 114/80 | HR 84 | Temp 98.2°F | Resp 16

## 2021-07-07 DIAGNOSIS — R8290 Unspecified abnormal findings in urine: Secondary | ICD-10-CM | POA: Diagnosis not present

## 2021-07-07 MED ORDER — SULFAMETHOXAZOLE-TRIMETHOPRIM 800-160 MG PO TABS: 1.0000 | ORAL_TABLET | Freq: Two times a day (BID) | ORAL | 0 refills | Status: AC

## 2021-07-07 NOTE — Progress Notes (Signed)
° ° °  Kendra Griffin [birth year] [id number]        45 y.o.  [id number]   RP: Urine odor with burning when urinating  HPI: C/o of urine odor & burning with urination.  No blood seen in urine.  No pelvic pain.  No abnormal vaginal discharge.  Declines STI screen. No fever.   OB History  Gravida Para Term Preterm AB Living  3 2 2   1 2   SAB IAB Ectopic Multiple Live Births               # Outcome Date GA Lbr Len/2nd Weight Sex Delivery Anes PTL Lv  3 AB           2 Term           1 Term             Past medical history,surgical history, problem list, medications, allergies, family history and social history were all reviewed and documented in the EPIC chart.   Directed ROS with pertinent positives and negatives documented in the history of present illness/assessment and plan.  Exam:  Vitals:   07/07/21 1118  BP: 114/80  Pulse: 84  Resp: 16  Temp: 98.2 F (36.8 C)  TempSrc: Oral   General appearance:  Normal  CVAT Neg Bilaterally  Gynecologic exam: Deferred  U/A: Yellow clear, Nit Neg, RBC 0-2, WBC 10-20, Bacteria moderate.  Pending U. Culture.   Assessment/Plan:  45 y.o. [id number]   1. Abnormal urine odor UTI Sxs with abnormal U/A c/w probable acute cystitis.  Decision to treat with Bactrim DS.  Usage reviewed and prescription sent to pharmacy. U. Culture pending. - Urinalysis,Complete w/RFL Culture  Other orders - sulfamethoxazole-trimethoprim (BACTRIM DS) 800-160 MG tablet; Take 1 tablet by mouth 2 (two) times daily for 3 days.   Princess Bruins MD, 11:34 AM 07/07/2021

## 2021-07-10 ENCOUNTER — Encounter: Payer: Self-pay | Admitting: Obstetrics & Gynecology

## 2021-07-10 ENCOUNTER — Other Ambulatory Visit: Payer: Self-pay | Admitting: Family Medicine

## 2021-07-10 LAB — URINALYSIS, COMPLETE W/RFL CULTURE
Casts: NONE SEEN /LPF
Crystals: NONE SEEN /HPF
Glucose, UA: NEGATIVE
Hgb urine dipstick: NEGATIVE
Hyaline Cast: NONE SEEN /LPF
Nitrites, Initial: NEGATIVE
Specific Gravity, Urine: 1.02 (ref 1.001–1.035)
Yeast: NONE SEEN /HPF
pH: 7 (ref 5.0–8.0)

## 2021-07-10 LAB — URINE CULTURE
MICRO NUMBER:: 12750293
SPECIMEN QUALITY:: ADEQUATE

## 2021-07-10 LAB — CULTURE INDICATED

## 2021-07-13 ENCOUNTER — Telehealth: Payer: Self-pay | Admitting: Podiatry

## 2021-07-13 NOTE — Telephone Encounter (Signed)
Orthotics in.. lvm for pt to call to schedule an appt to pick them up. °

## 2021-07-15 ENCOUNTER — Telehealth: Payer: Self-pay | Admitting: Podiatry

## 2021-07-15 NOTE — Telephone Encounter (Signed)
Pt left message returning my call to schedule an appt to pick up her orthotics.  I returned call and left message for pt to call me back to schedule an appt to pick them up.

## 2021-07-20 ENCOUNTER — Other Ambulatory Visit: Payer: Self-pay | Admitting: Family Medicine

## 2021-07-21 NOTE — Telephone Encounter (Signed)
Rx refill request approved per Dr. Corey's orders. 

## 2021-07-22 MED ORDER — IBUPROFEN 800 MG PO TABS
800.0000 mg | ORAL_TABLET | Freq: Three times a day (TID) | ORAL | 1 refills | Status: DC | PRN
Start: 2021-07-22 — End: 2021-12-22

## 2021-07-28 ENCOUNTER — Telehealth: Payer: Self-pay | Admitting: Podiatry

## 2021-07-28 NOTE — Telephone Encounter (Signed)
Pt left message last week about insoles.  I returned call and left message after researching and she just needs an appt to pick them up.

## 2021-09-17 ENCOUNTER — Telehealth: Payer: Self-pay | Admitting: Podiatry

## 2021-09-17 NOTE — Telephone Encounter (Signed)
Lvm for patient to call and schedule an appointment for orthotics  ?

## 2021-10-29 ENCOUNTER — Other Ambulatory Visit: Payer: BC Managed Care – PPO

## 2021-11-04 DIAGNOSIS — E890 Postprocedural hypothyroidism: Secondary | ICD-10-CM | POA: Diagnosis not present

## 2021-11-10 ENCOUNTER — Other Ambulatory Visit: Payer: Self-pay | Admitting: Podiatry

## 2021-11-11 DIAGNOSIS — E669 Obesity, unspecified: Secondary | ICD-10-CM | POA: Diagnosis not present

## 2021-11-11 DIAGNOSIS — I10 Essential (primary) hypertension: Secondary | ICD-10-CM | POA: Diagnosis not present

## 2021-11-11 DIAGNOSIS — E890 Postprocedural hypothyroidism: Secondary | ICD-10-CM | POA: Diagnosis not present

## 2021-11-25 ENCOUNTER — Ambulatory Visit: Payer: BC Managed Care – PPO

## 2021-11-25 DIAGNOSIS — M722 Plantar fascial fibromatosis: Secondary | ICD-10-CM

## 2021-11-25 NOTE — Progress Notes (Signed)

## 2021-12-02 ENCOUNTER — Other Ambulatory Visit: Payer: Self-pay | Admitting: Podiatry

## 2021-12-03 ENCOUNTER — Encounter: Payer: Self-pay | Admitting: Podiatry

## 2021-12-03 ENCOUNTER — Ambulatory Visit (INDEPENDENT_AMBULATORY_CARE_PROVIDER_SITE_OTHER): Payer: BC Managed Care – PPO | Admitting: Podiatry

## 2021-12-03 DIAGNOSIS — M7751 Other enthesopathy of right foot: Secondary | ICD-10-CM

## 2021-12-03 DIAGNOSIS — M722 Plantar fascial fibromatosis: Secondary | ICD-10-CM

## 2021-12-03 MED ORDER — TRIAMCINOLONE ACETONIDE 10 MG/ML IJ SUSP
10.0000 mg | Freq: Once | INTRAMUSCULAR | Status: AC
  Administered 2021-12-03: 10 mg

## 2021-12-04 NOTE — Progress Notes (Signed)
Subjective:  ? ?Patient ID: Kendra Griffin, female   DOB: 46 y.o.   MRN: [id number]  ? ?HPI ?Patient states she has been doing pretty good but still having some pain in her heel and stated it has come back recently.  States she was doing well and not sure what she may have done and she did get a night splint but has not been using it ? ? ?ROS ? ? ?   ?Objective:  ?Physical Exam  ?Neurovascular status intact with discomfort in the medial band of the fascia that is present and into the central band with fluid buildup moderate depression of the arch noted ? ?   ?Assessment:  ?Acute plantar fasciitis right still present improved but now quite sore with pressure again age ? ?   ?Plan:  ?NP reviewed utilization of night splint explained ice therapy and went ahead and did sterile prep and injected the plantar fascia right 3 mg Kenalog 5 mg Xylocaine and advised on supportive shoes.  Patient will be seen back to recheck ?   ? ? ?

## 2022-01-18 IMAGING — DX DG CERVICAL SPINE 2 OR 3 VIEWS
3 series · 3 of 3 positions shown · non-contrast
Comparison: None.

CLINICAL DATA: Cervicalgia

EXAM:
CERVICAL SPINE - 2-3 VIEW

[c-spine lat]
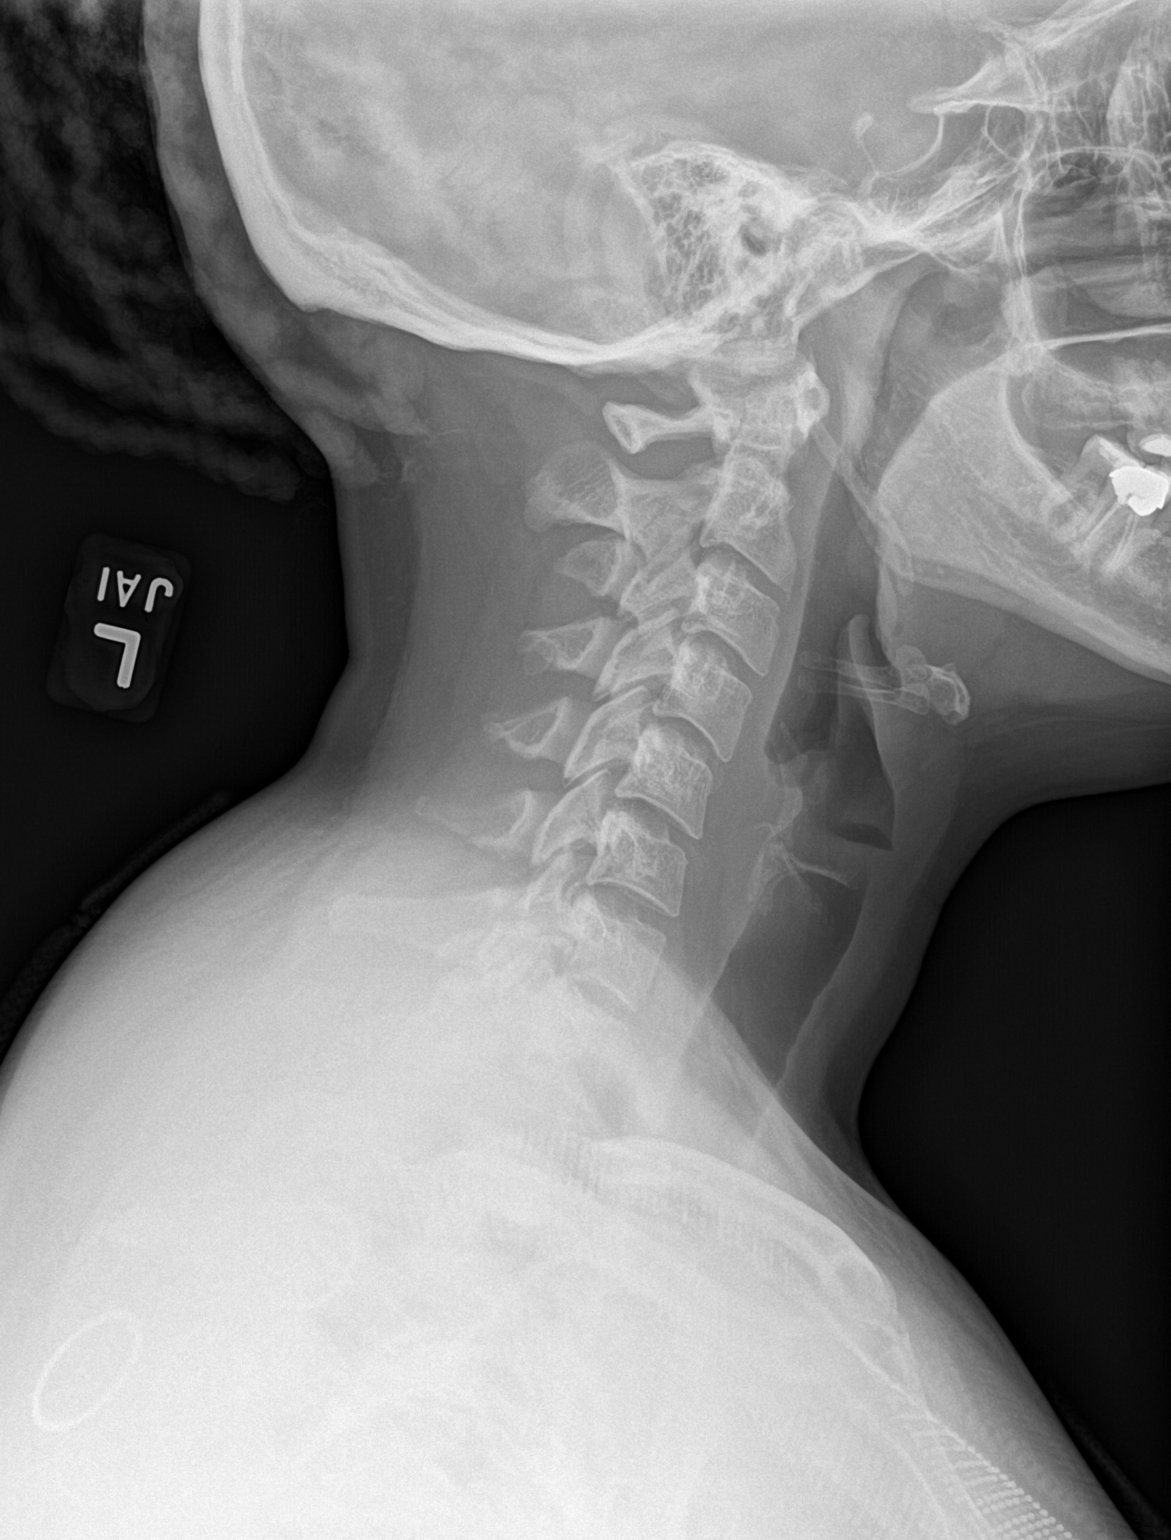

[c-spine ap]
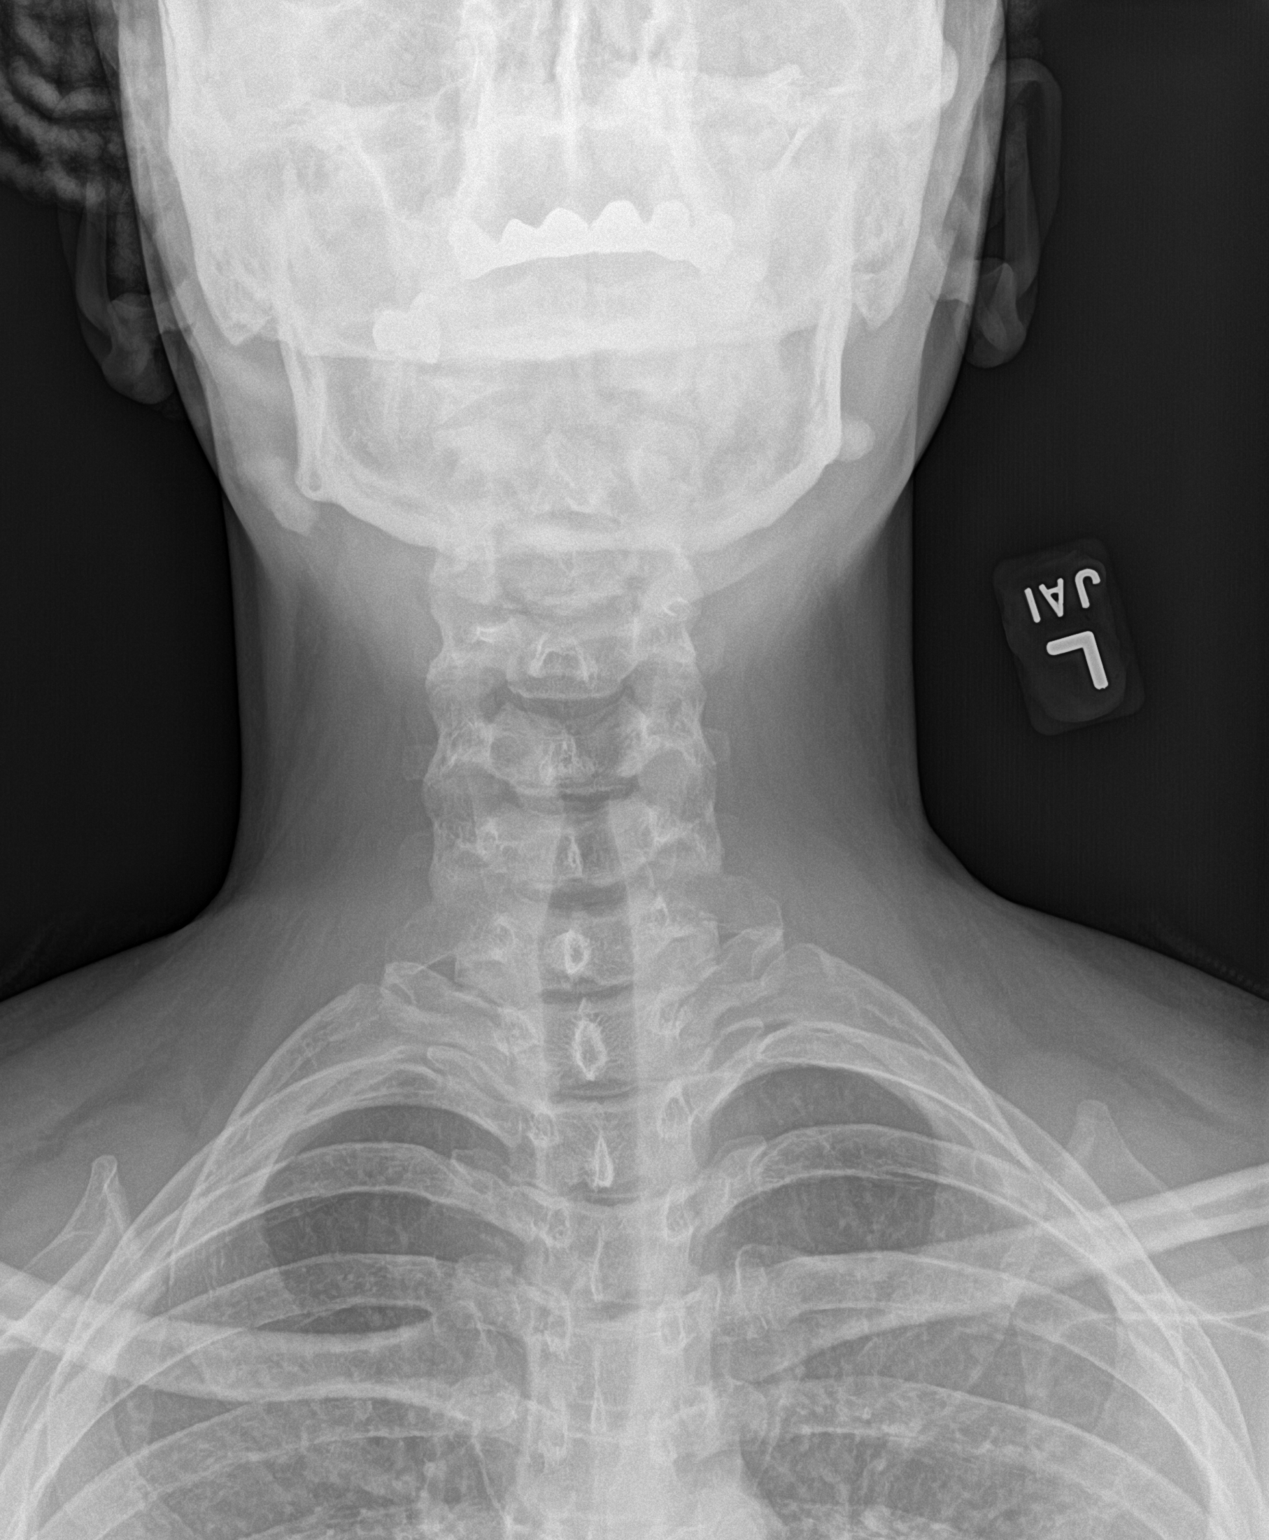

[c-spine open mouth]
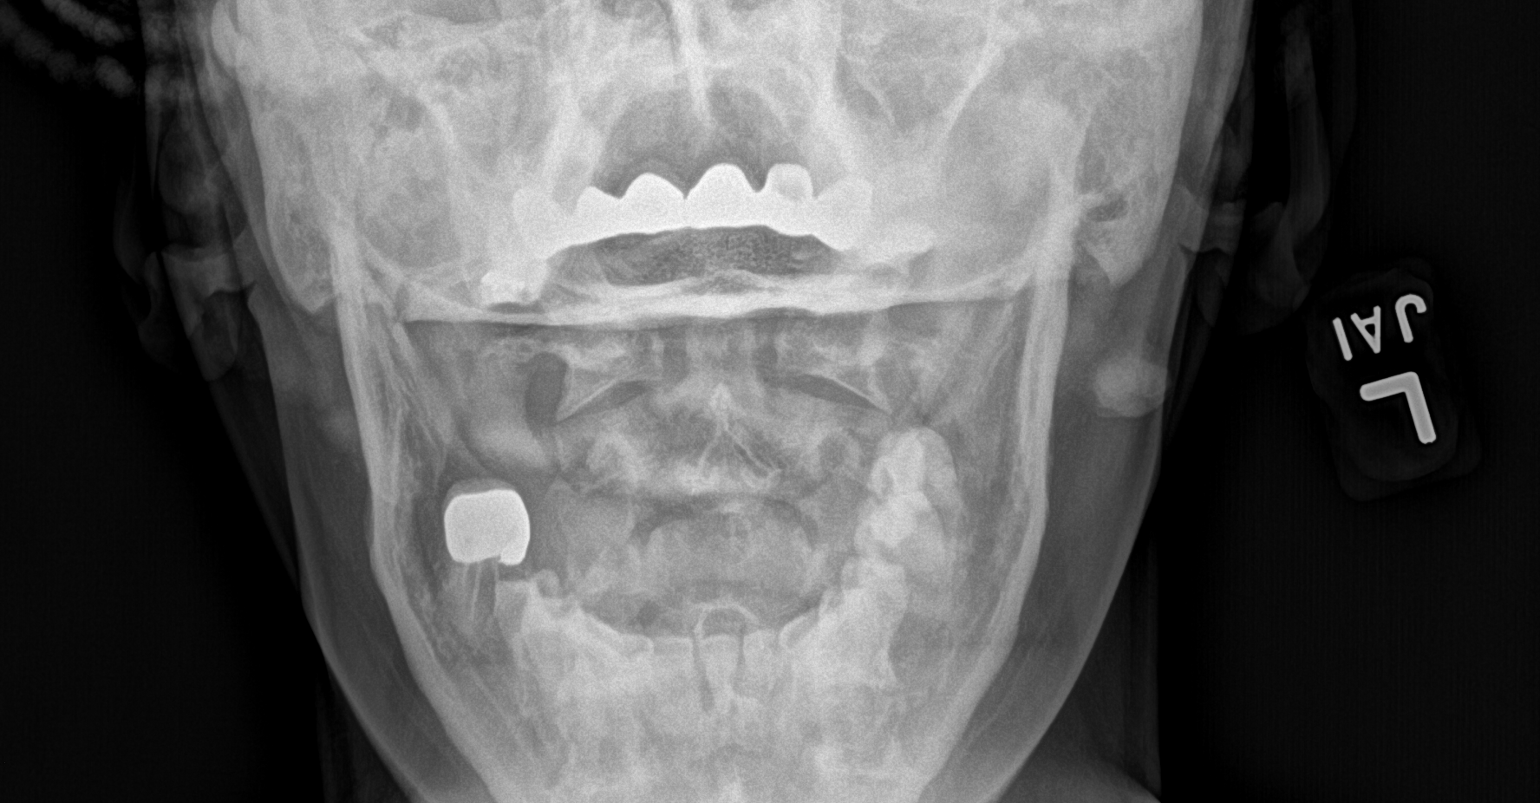

[3 of 3 positions shown; findings below may reference images not displayed]

FINDINGS: Frontal, lateral, and open-mouth odontoid images were obtained.
There is no fracture or spondylolisthesis. Prevertebral soft tissues
and predental space regions are normal. The disc spaces appear
unremarkable. There is lack of cervical lordosis. Lung apices are
clear. There is elevation of the C7 transverse processes bilaterally
with rudimentary cervical ribs bilaterally.
IMPRESSION: Lack of cervical lordosis, a finding likely indicative of a degree
of muscle spasm. No fracture or spondylolisthesis. No appreciable
underlying arthropathic change.

## 2022-03-10 ENCOUNTER — Ambulatory Visit (INDEPENDENT_AMBULATORY_CARE_PROVIDER_SITE_OTHER): Payer: BC Managed Care – PPO | Admitting: Nurse Practitioner

## 2022-03-10 ENCOUNTER — Encounter: Payer: Self-pay | Admitting: Nurse Practitioner

## 2022-03-10 VITALS — BP 122/84 | HR 91 | Ht 66.5 in | Wt 226.0 lb

## 2022-03-10 DIAGNOSIS — Z01419 Encounter for gynecological examination (general) (routine) without abnormal findings: Secondary | ICD-10-CM

## 2022-03-10 DIAGNOSIS — Z113 Encounter for screening for infections with a predominantly sexual mode of transmission: Secondary | ICD-10-CM

## 2022-03-10 DIAGNOSIS — R519 Headache, unspecified: Secondary | ICD-10-CM | POA: Diagnosis not present

## 2022-03-10 NOTE — Patient Instructions (Signed)
Schedule Colonoscopy! ?[facility] ?[phone] ?[address] [place], [place] [address] ? ?
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
## 2022-03-11 ENCOUNTER — Telehealth: Payer: Self-pay | Admitting: *Deleted

## 2022-03-11 DIAGNOSIS — R519 Headache, unspecified: Secondary | ICD-10-CM

## 2022-03-11 LAB — COMPREHENSIVE METABOLIC PANEL
AG Ratio: 1.6 (calc) (ref 1.0–2.5)
ALT: 14 U/L (ref 6–29)
AST: 15 U/L (ref 10–35)
Albumin: 3.9 g/dL (ref 3.6–5.1)
Alkaline phosphatase (APISO): 58 U/L (ref 31–125)
BUN: 11 mg/dL (ref 7–25)
CO2: 25 mmol/L (ref 20–32)
Calcium: 8.8 mg/dL (ref 8.6–10.2)
Chloride: 107 mmol/L (ref 98–110)
Creat: 0.93 mg/dL (ref 0.50–0.99)
Globulin: 2.4 g/dL (calc) (ref 1.9–3.7)
Glucose, Bld: 86 mg/dL (ref 65–99)
Potassium: 4.4 mmol/L (ref 3.5–5.3)
Sodium: 141 mmol/L (ref 135–146)
Total Bilirubin: 0.3 mg/dL (ref 0.2–1.2)
Total Protein: 6.3 g/dL (ref 6.1–8.1)

## 2022-03-11 LAB — CBC WITH DIFFERENTIAL/PLATELET
Absolute Monocytes: 280 cells/uL (ref 200–950)
Basophils Absolute: 22 cells/uL (ref 0–200)
Basophils Relative: 0.5 %
Eosinophils Absolute: 90 cells/uL (ref 15–500)
Eosinophils Relative: 2.1 %
HCT: 35.7 % (ref 35.0–45.0)
Hemoglobin: 12.1 g/dL (ref 11.7–15.5)
Lymphs Abs: 1436 cells/uL (ref 850–3900)
MCH: 30.2 pg (ref 27.0–33.0)
MCHC: 33.9 g/dL (ref 32.0–36.0)
MCV: 89 fL (ref 80.0–100.0)
MPV: 10.3 fL (ref 7.5–12.5)
Monocytes Relative: 6.5 %
Neutro Abs: 2473 cells/uL (ref 1500–7800)
Neutrophils Relative %: 57.5 %
Platelets: 184 10*3/uL (ref 140–400)
RBC: 4.01 10*6/uL (ref 3.80–5.10)
RDW: 13.4 % (ref 11.0–15.0)
Total Lymphocyte: 33.4 %
WBC: 4.3 10*3/uL (ref 3.8–10.8)

## 2022-03-11 LAB — SURESWAB CT/NG/T. VAGINALIS
C. trachomatis RNA, TMA: NOT DETECTED
N. gonorrhoeae RNA, TMA: NOT DETECTED
Trichomonas vaginalis RNA: NOT DETECTED

## 2022-03-11 LAB — HIV ANTIBODY (ROUTINE TESTING W REFLEX): HIV 1&2 Ab, 4th Generation: NONREACTIVE

## 2022-03-11 LAB — RPR: RPR Ser Ql: NONREACTIVE

## 2022-03-11 NOTE — Telephone Encounter (Signed)
-----   Message from Tamela Gammon, NP sent at 03/10/2022  4:28 PM EDT ----- Regarding: Neurology referral Please send referral to neurology for headaches. Thanks.

## 2022-03-11 NOTE — Telephone Encounter (Signed)
Referral placed at [facility] they will call to schedule.

## 2022-03-19 NOTE — Telephone Encounter (Signed)
Neuro called x 1

## 2022-03-31 NOTE — Telephone Encounter (Signed)
Yes, OK to close. Thanks.  

## 2022-03-31 NOTE — Telephone Encounter (Signed)
Neurology called patient x 3 and not return call. I called patient and left a detailed message asking her to call [facility] if she still wants to proceed with referral.   Okay to close this encounter?

## 2022-07-30 ENCOUNTER — Other Ambulatory Visit: Payer: Self-pay | Admitting: Family Medicine

## 2022-12-01 DIAGNOSIS — E890 Postprocedural hypothyroidism: Secondary | ICD-10-CM | POA: Diagnosis not present

## 2022-12-08 DIAGNOSIS — E669 Obesity, unspecified: Secondary | ICD-10-CM | POA: Diagnosis not present

## 2022-12-08 DIAGNOSIS — I10 Essential (primary) hypertension: Secondary | ICD-10-CM | POA: Diagnosis not present

## 2022-12-08 DIAGNOSIS — E890 Postprocedural hypothyroidism: Secondary | ICD-10-CM | POA: Diagnosis not present

## 2022-12-28 ENCOUNTER — Ambulatory Visit: Payer: BC Managed Care – PPO | Admitting: Radiology

## 2022-12-28 ENCOUNTER — Ambulatory Visit (INDEPENDENT_AMBULATORY_CARE_PROVIDER_SITE_OTHER): Payer: BC Managed Care – PPO | Admitting: Radiology

## 2022-12-28 VITALS — BP 124/84

## 2022-12-28 DIAGNOSIS — Z1211 Encounter for screening for malignant neoplasm of colon: Secondary | ICD-10-CM

## 2022-12-28 DIAGNOSIS — R300 Dysuria: Secondary | ICD-10-CM

## 2022-12-28 DIAGNOSIS — Z113 Encounter for screening for infections with a predominantly sexual mode of transmission: Secondary | ICD-10-CM

## 2022-12-28 MED ORDER — SULFAMETHOXAZOLE-TRIMETHOPRIM 800-160 MG PO TABS: 1.0000 | ORAL_TABLET | Freq: Two times a day (BID) | ORAL | 0 refills | Status: DC

## 2022-12-28 NOTE — Progress Notes (Signed)
      Subjective: Kendra Griffin is a 47 y.o. female s/p TAH for fibroids who complains of urinary frequency, urgency, dysuria, lower right back pain. Symptoms began 1 week ago. Desires STI screening. Requests referral for colon cancer screening.    Review of Systems  All other systems reviewed and are negative.   Past Medical History:  Diagnosis Date   Bacterial vaginitis    Recurrent   Hernia    Hypothyroidism    Plantar fasciitis    Stress headaches    work related stress      Objective:  Today's Vitals   12/28/22 1510  BP: 124/84   There is no height or weight on file to calculate BMI.   Physical Exam Exam conducted with a chaperone present.  Constitutional:      Appearance: Normal appearance. She is normal weight.  Pulmonary:     Effort: Pulmonary effort is normal.  Abdominal:     General: Abdomen is flat. Bowel sounds are normal.     Palpations: Abdomen is soft.  Genitourinary:    General: Normal vulva.     Vagina: Vaginal discharge present.     Adnexa: Right adnexa normal and left adnexa normal.  Neurological:     Mental Status: She is alert.  Psychiatric:        Mood and Affect: Mood normal.        Thought Content: Thought content normal.        Judgment: Judgment normal.     Urine dipstick shows positive for protein and positive for leukocytes.  Micro exam: 40-60 WBC's per HPF,= RBC's per HPF, and + bacteria.    Raynelle Fanning, CMA present for exam  Assessment:/Plan:   1. Dysuria - Urinalysis,Complete w/RFL Culture - sulfamethoxazole-trimethoprim (BACTRIM DS) 800-160 MG tablet; Take 1 tablet by mouth 2 (two) times daily.  Dispense: 6 tablet; Refill: 0  2. Screening for colon cancer - Cologuard  3. Screening for STDs (sexually transmitted diseases) - SURESWAB CT/NG/T. vaginalis    Will contact patient with results of testing completed today. Avoid intercourse until symptoms are resolved. Safe sex encouraged. Avoid the use of soaps or  perfumed products in the peri area. Avoid tub baths and sitting in sweaty or wet clothing for prolonged periods of time.

## 2022-12-29 LAB — CULTURE INDICATED

## 2022-12-30 LAB — SURESWAB CT/NG/T. VAGINALIS
C. trachomatis RNA, TMA: NOT DETECTED
N. gonorrhoeae RNA, TMA: NOT DETECTED
Trichomonas vaginalis RNA: NOT DETECTED

## 2022-12-31 LAB — URINALYSIS, COMPLETE W/RFL CULTURE
Bilirubin Urine: NEGATIVE
Glucose, UA: NEGATIVE
Hgb urine dipstick: NEGATIVE
Hyaline Cast: NONE SEEN /LPF
Nitrites, Initial: NEGATIVE
RBC / HPF: NONE SEEN /HPF (ref 0–2)
Specific Gravity, Urine: 1.02 (ref 1.001–1.035)
pH: 6 (ref 5.0–8.0)

## 2022-12-31 LAB — URINE CULTURE
MICRO NUMBER:: 15038730
SPECIMEN QUALITY:: ADEQUATE

## 2023-03-16 ENCOUNTER — Encounter: Payer: Self-pay | Admitting: Nurse Practitioner

## 2023-03-16 ENCOUNTER — Ambulatory Visit (INDEPENDENT_AMBULATORY_CARE_PROVIDER_SITE_OTHER): Payer: BC Managed Care – PPO | Admitting: Nurse Practitioner

## 2023-03-16 VITALS — BP 132/74 | HR 84 | Ht 65.35 in | Wt 215.0 lb

## 2023-03-16 DIAGNOSIS — B009 Herpesviral infection, unspecified: Secondary | ICD-10-CM

## 2023-03-16 DIAGNOSIS — R519 Headache, unspecified: Secondary | ICD-10-CM | POA: Diagnosis not present

## 2023-03-16 DIAGNOSIS — Z01419 Encounter for gynecological examination (general) (routine) without abnormal findings: Secondary | ICD-10-CM

## 2023-03-16 MED ORDER — IBUPROFEN 800 MG PO TABS: 800.0000 mg | ORAL_TABLET | Freq: Three times a day (TID) | ORAL | 1 refills | Status: DC | PRN

## 2023-03-16 MED ORDER — VALACYCLOVIR HCL 500 MG PO TABS
500.0000 mg | ORAL_TABLET | Freq: Two times a day (BID) | ORAL | 1 refills | Status: DC
Start: 2023-03-16 — End: 2024-03-23
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
## 2023-03-17 LAB — COMPREHENSIVE METABOLIC PANEL
AG Ratio: 1.6 (calc) (ref 1.0–2.5)
ALT: 13 U/L (ref 6–29)
AST: 12 U/L (ref 10–35)
Albumin: 4.1 g/dL (ref 3.6–5.1)
Alkaline phosphatase (APISO): 50 U/L (ref 31–125)
BUN: 11 mg/dL (ref 7–25)
CO2: 23 mmol/L (ref 20–32)
Calcium: 8.8 mg/dL (ref 8.6–10.2)
Chloride: 105 mmol/L (ref 98–110)
Creat: 0.9 mg/dL (ref 0.50–0.99)
Globulin: 2.6 g/dL (ref 1.9–3.7)
Glucose, Bld: 89 mg/dL (ref 65–99)
Potassium: 3.7 mmol/L (ref 3.5–5.3)
Sodium: 138 mmol/L (ref 135–146)
Total Bilirubin: 0.3 mg/dL (ref 0.2–1.2)
Total Protein: 6.7 g/dL (ref 6.1–8.1)

## 2023-03-17 LAB — CBC WITH DIFFERENTIAL/PLATELET
Absolute Monocytes: 372 {cells}/uL (ref 200–950)
Basophils Absolute: 20 {cells}/uL (ref 0–200)
Basophils Relative: 0.5 %
Eosinophils Absolute: 100 {cells}/uL (ref 15–500)
Eosinophils Relative: 2.5 %
HCT: 34.5 % — ABNORMAL LOW (ref 35.0–45.0)
Hemoglobin: 11.3 g/dL — ABNORMAL LOW (ref 11.7–15.5)
Lymphs Abs: 1644 {cells}/uL (ref 850–3900)
MCH: 29.4 pg (ref 27.0–33.0)
MCHC: 32.8 g/dL (ref 32.0–36.0)
MCV: 89.8 fL (ref 80.0–100.0)
MPV: 10.4 fL (ref 7.5–12.5)
Monocytes Relative: 9.3 %
Neutro Abs: 1864 {cells}/uL (ref 1500–7800)
Neutrophils Relative %: 46.6 %
Platelets: 207 10*3/uL (ref 140–400)
RBC: 3.84 10*6/uL (ref 3.80–5.10)
RDW: 14.1 % (ref 11.0–15.0)
Total Lymphocyte: 41.1 %
WBC: 4 10*3/uL (ref 3.8–10.8)

## 2023-04-14 DIAGNOSIS — E890 Postprocedural hypothyroidism: Secondary | ICD-10-CM | POA: Diagnosis not present

## 2023-04-19 ENCOUNTER — Telehealth: Payer: Self-pay

## 2023-04-19 NOTE — Telephone Encounter (Signed)
-----   Message from [place], [place] B sent at 04/12/2023 12:04 PM EDT ----- Please reach out to patient to remind her to complete cologuard ordered 12/28/22. Thanks ----- Message ----- From: SYSTEM Sent: 03/28/2023  12:15 AM EDT To: Tanda Rockers, NP

## 2023-04-19 NOTE — Progress Notes (Signed)
Message left on voicemail letting her know to complete her cologuard.

## 2023-04-19 NOTE — Telephone Encounter (Signed)
I called & left message for her to complete her cologuard

## 2023-05-19 DIAGNOSIS — E669 Obesity, unspecified: Secondary | ICD-10-CM | POA: Diagnosis not present

## 2023-05-19 DIAGNOSIS — E890 Postprocedural hypothyroidism: Secondary | ICD-10-CM | POA: Diagnosis not present

## 2023-05-19 DIAGNOSIS — I10 Essential (primary) hypertension: Secondary | ICD-10-CM | POA: Diagnosis not present

## 2023-05-24 ENCOUNTER — Ambulatory Visit (INDEPENDENT_AMBULATORY_CARE_PROVIDER_SITE_OTHER): Payer: BC Managed Care – PPO | Admitting: Obstetrics and Gynecology

## 2023-05-24 ENCOUNTER — Encounter: Payer: Self-pay | Admitting: Obstetrics and Gynecology

## 2023-05-24 VITALS — BP 118/78 | HR 84

## 2023-05-24 DIAGNOSIS — Z202 Contact with and (suspected) exposure to infections with a predominantly sexual mode of transmission: Secondary | ICD-10-CM

## 2023-05-24 DIAGNOSIS — Z113 Encounter for screening for infections with a predominantly sexual mode of transmission: Secondary | ICD-10-CM

## 2023-05-24 DIAGNOSIS — R350 Frequency of micturition: Secondary | ICD-10-CM | POA: Diagnosis not present

## 2023-05-24 DIAGNOSIS — G478 Other sleep disorders: Secondary | ICD-10-CM | POA: Diagnosis not present

## 2023-05-24 LAB — URINALYSIS, COMPLETE W/RFL CULTURE
Bacteria, UA: NONE SEEN /[HPF]
Bilirubin Urine: NEGATIVE
Glucose, UA: NEGATIVE
Hgb urine dipstick: NEGATIVE
Hyaline Cast: NONE SEEN /[LPF]
Ketones, ur: NEGATIVE
Leukocyte Esterase: NEGATIVE
Nitrites, Initial: NEGATIVE
Protein, ur: NEGATIVE
RBC / HPF: NONE SEEN /[HPF] (ref 0–2)
Specific Gravity, Urine: 1.02 (ref 1.001–1.035)
WBC, UA: NONE SEEN /[HPF] (ref 0–5)
pH: 7.5 (ref 5.0–8.0)

## 2023-05-24 LAB — NO CULTURE INDICATED
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
## 2023-05-25 LAB — RPR: RPR Ser Ql: NONREACTIVE

## 2023-05-25 LAB — SURESWAB® ADVANCED VAGINITIS PLUS,TMA
C. trachomatis RNA, TMA: NOT DETECTED
CANDIDA SPECIES: NOT DETECTED
Candida glabrata: NOT DETECTED
N. gonorrhoeae RNA, TMA: NOT DETECTED
SURESWAB(R) ADV BACTERIAL VAGINOSIS(BV),TMA: POSITIVE — AB
TRICHOMONAS VAGINALIS (TV),TMA: NOT DETECTED

## 2023-05-25 LAB — HEPATITIS B SURFACE ANTIGEN: Hepatitis B Surface Ag: NONREACTIVE

## 2023-05-25 LAB — HIV ANTIBODY (ROUTINE TESTING W REFLEX): HIV 1&2 Ab, 4th Generation: NONREACTIVE

## 2023-05-25 LAB — HEPATITIS C ANTIBODY: Hepatitis C Ab: NONREACTIVE

## 2023-05-26 ENCOUNTER — Other Ambulatory Visit: Payer: Self-pay

## 2023-05-26 MED ORDER — METRONIDAZOLE 500 MG PO TABS: 500.0000 mg | ORAL_TABLET | Freq: Two times a day (BID) | ORAL | 0 refills | Status: DC

## 2023-06-13 ENCOUNTER — Telehealth: Payer: Self-pay | Admitting: *Deleted

## 2023-06-13 NOTE — Telephone Encounter (Signed)
Patient left message requesting Rx for yeast. Tx for BV on 05/26/23.

## 2023-06-13 NOTE — Telephone Encounter (Signed)
Patient returned call, left message requesting call back.

## 2023-06-13 NOTE — Telephone Encounter (Signed)
 Left message to call [facility] Triage at [phone], option 4.

## 2023-06-14 NOTE — Telephone Encounter (Signed)
Patient left message requesting Rx for yeast. States she completed Rx for BV and now has a yeast infection, requesting return call.

## 2023-06-14 NOTE — Telephone Encounter (Signed)
 Left message to call [facility] Triage at [phone], option 4.

## 2023-06-15 MED ORDER — FLUCONAZOLE 150 MG PO TABS
150.0000 mg | ORAL_TABLET | Freq: Once | ORAL | 0 refills | Status: AC
Start: 2023-06-15 — End: 2023-06-15

## 2023-06-15 NOTE — Telephone Encounter (Signed)
Patient notified. Encounter closed

## 2023-06-15 NOTE — Telephone Encounter (Signed)
Spoke with patient. Patient Tx for BV on 05/26/23, did not complete Flagyl, has 2 days remaining. States she developed vaginal itching and white vaginal d/c, then stopped flagyl. Patient is requesting Rx for yeast. Advised I will forward to Dr. Karma Greaser to review and our office will f/u, patient agreeable.   Routing to Dr. Karma Greaser

## 2023-06-30 DIAGNOSIS — E890 Postprocedural hypothyroidism: Secondary | ICD-10-CM | POA: Diagnosis not present

## 2023-08-26 DIAGNOSIS — Z1231 Encounter for screening mammogram for malignant neoplasm of breast: Secondary | ICD-10-CM | POA: Diagnosis not present

## 2023-08-26 DIAGNOSIS — R92333 Mammographic heterogeneous density, bilateral breasts: Secondary | ICD-10-CM | POA: Diagnosis not present

## 2023-09-21 DIAGNOSIS — E890 Postprocedural hypothyroidism: Secondary | ICD-10-CM | POA: Diagnosis not present

## 2023-10-26 DIAGNOSIS — I10 Essential (primary) hypertension: Secondary | ICD-10-CM | POA: Diagnosis not present

## 2023-10-26 DIAGNOSIS — E890 Postprocedural hypothyroidism: Secondary | ICD-10-CM | POA: Diagnosis not present

## 2023-10-26 DIAGNOSIS — E669 Obesity, unspecified: Secondary | ICD-10-CM | POA: Diagnosis not present

## 2023-11-07 DIAGNOSIS — I898 Other specified noninfective disorders of lymphatic vessels and lymph nodes: Secondary | ICD-10-CM | POA: Diagnosis not present

## 2023-11-07 DIAGNOSIS — R928 Other abnormal and inconclusive findings on diagnostic imaging of breast: Secondary | ICD-10-CM | POA: Diagnosis not present

## 2023-11-07 DIAGNOSIS — R92332 Mammographic heterogeneous density, left breast: Secondary | ICD-10-CM | POA: Diagnosis not present

## 2024-02-27 ENCOUNTER — Telehealth: Payer: Self-pay | Admitting: *Deleted

## 2024-02-27 DIAGNOSIS — N6489 Other specified disorders of breast: Secondary | ICD-10-CM

## 2024-02-27 DIAGNOSIS — R590 Localized enlarged lymph nodes: Secondary | ICD-10-CM

## 2024-02-27 NOTE — Telephone Encounter (Signed)
 Spoke with patient. Screening MMG at [facility] on 08/31/23, followed by Dx Left breast with US  on 11/07/23. Patient is requesting an order for MRI.   Patient is uncertain where she wants MRI to be sent yet, will let me know upon return call. Patient denies any breast pain, lumps or concerns.    Tiffany -please review and advise on MRI, results in CareEverywhere.

## 2024-02-28 NOTE — Telephone Encounter (Signed)
 Yes, OK to order breast MRI for left breast asymmetry and enlarged axillary lymph node.

## 2024-02-28 NOTE — Telephone Encounter (Signed)
 Call placed to patient, no answer. Voicemail not set up.

## 2024-03-06 NOTE — Telephone Encounter (Signed)
 Spoke with patient, advised per Tiffany. MRI order placed to [facility] per patient request. Number provided for scheduling. Advised patient if PA required our office will complete. If any questions regarding OOP cost, check with insurance and imaging center.  Patient aware to call if any additional assistance needed.   Placed in [facility] hold.   Encounter closed.

## 2024-03-22 ENCOUNTER — Telehealth: Payer: Self-pay

## 2024-03-22 NOTE — Telephone Encounter (Signed)
 Med refill request: Valtrex  500 mg tablet Start: 03/16/23 - #30 tablet with 1 refill LOV: 05/24/23 Last AEX: 03/16/23 Next AEX: 04/16/24 Last [facility] (if hormonal med) Refill authorized: Please Advise?

## 2024-03-23 ENCOUNTER — Other Ambulatory Visit: Payer: Self-pay | Admitting: Nurse Practitioner

## 2024-03-23 DIAGNOSIS — B009 Herpesviral infection, unspecified: Secondary | ICD-10-CM

## 2024-03-23 MED ORDER — VALACYCLOVIR HCL 500 MG PO TABS: 500.0000 mg | ORAL_TABLET | Freq: Two times a day (BID) | ORAL | 1 refills | Status: AC

## 2024-03-31 ENCOUNTER — Ambulatory Visit
Admission: RE | Admit: 2024-03-31 | Discharge: 2024-03-31 | Disposition: A | Discharge status: Home / Self Care | Payer: Self-pay | Source: Ambulatory Visit | Attending: Nurse Practitioner

## 2024-03-31 ENCOUNTER — Other Ambulatory Visit: Payer: Self-pay | Admitting: Podiatry

## 2024-03-31 DIAGNOSIS — R590 Localized enlarged lymph nodes: Secondary | ICD-10-CM

## 2024-03-31 DIAGNOSIS — N6489 Other specified disorders of breast: Secondary | ICD-10-CM

## 2024-03-31 MED ORDER — GADOPICLENOL 0.5 MMOL/ML IV SOLN
9.0000 mL | Freq: Once | INTRAVENOUS | Status: AC | PRN
  Administered 2024-03-31: 9 mL via INTRAVENOUS

## 2024-04-04 ENCOUNTER — Ambulatory Visit: Payer: Self-pay | Admitting: Nurse Practitioner

## 2024-04-04 ENCOUNTER — Telehealth: Payer: Self-pay | Admitting: Nurse Practitioner

## 2024-04-04 DIAGNOSIS — N6314 Unspecified lump in the right breast, lower inner quadrant: Secondary | ICD-10-CM

## 2024-04-04 NOTE — Telephone Encounter (Signed)
 Patient left message on triage line trying to return call about results.

## 2024-04-04 NOTE — Telephone Encounter (Signed)
 Call attempted to discuss breast MRI results and recommendations. Will try again later.

## 2024-04-04 NOTE — Telephone Encounter (Signed)
 Orders placed.   TBC notified for scheduling.

## 2024-04-05 NOTE — Telephone Encounter (Signed)
 Per review of EPIC, patient is scheduled for bx on 04/09/24.

## 2024-04-09 ENCOUNTER — Ambulatory Visit
Admission: RE | Admit: 2024-04-09 | Discharge: 2024-04-09 | Disposition: A | Discharge status: Home / Self Care | Source: Ambulatory Visit | Attending: Nurse Practitioner | Admitting: Nurse Practitioner

## 2024-04-09 DIAGNOSIS — N6314 Unspecified lump in the right breast, lower inner quadrant: Secondary | ICD-10-CM

## 2024-04-09 DIAGNOSIS — D241 Benign neoplasm of right breast: Secondary | ICD-10-CM | POA: Diagnosis not present

## 2024-04-09 MED ORDER — GADOPICLENOL 0.5 MMOL/ML IV SOLN: 9.0000 mL | Freq: Once | INTRAVENOUS | Status: DC | PRN

## 2024-04-10 LAB — SURGICAL PATHOLOGY

## 2024-04-11 ENCOUNTER — Ambulatory Visit: Payer: Self-pay | Admitting: Nurse Practitioner

## 2024-04-16 ENCOUNTER — Encounter: Payer: Self-pay | Admitting: Nurse Practitioner

## 2024-04-16 ENCOUNTER — Ambulatory Visit (INDEPENDENT_AMBULATORY_CARE_PROVIDER_SITE_OTHER): Admitting: Nurse Practitioner

## 2024-04-16 VITALS — BP 118/78 | HR 82 | Ht 66.0 in | Wt 210.6 lb

## 2024-04-16 DIAGNOSIS — R519 Headache, unspecified: Secondary | ICD-10-CM

## 2024-04-16 DIAGNOSIS — N898 Other specified noninflammatory disorders of vagina: Secondary | ICD-10-CM | POA: Diagnosis not present

## 2024-04-16 DIAGNOSIS — Z01419 Encounter for gynecological examination (general) (routine) without abnormal findings: Secondary | ICD-10-CM | POA: Diagnosis not present

## 2024-04-16 DIAGNOSIS — Z113 Encounter for screening for infections with a predominantly sexual mode of transmission: Secondary | ICD-10-CM | POA: Diagnosis not present

## 2024-04-16 DIAGNOSIS — Z1331 Encounter for screening for depression: Secondary | ICD-10-CM

## 2024-04-16 DIAGNOSIS — E785 Hyperlipidemia, unspecified: Secondary | ICD-10-CM

## 2024-04-16 DIAGNOSIS — B009 Herpesviral infection, unspecified: Secondary | ICD-10-CM

## 2024-04-16 DIAGNOSIS — N760 Acute vaginitis: Secondary | ICD-10-CM

## 2024-04-16 LAB — WET PREP FOR TRICH, YEAST, CLUE

## 2024-04-16 MED ORDER — IBUPROFEN 800 MG PO TABS: 800.0000 mg | ORAL_TABLET | Freq: Three times a day (TID) | ORAL | 1 refills | Status: AC | PRN

## 2024-04-16 MED ORDER — METRONIDAZOLE 500 MG PO TABS: 500.0000 mg | ORAL_TABLET | Freq: Two times a day (BID) | ORAL | 0 refills | Status: AC
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
## 2024-04-17 ENCOUNTER — Ambulatory Visit: Payer: Self-pay | Admitting: Nurse Practitioner

## 2024-04-17 LAB — COMPREHENSIVE METABOLIC PANEL WITH GFR
AG Ratio: 1.5 (calc) (ref 1.0–2.5)
ALT: 10 U/L (ref 6–29)
AST: 10 U/L (ref 10–35)
Albumin: 4.4 g/dL (ref 3.6–5.1)
Alkaline phosphatase (APISO): 59 U/L (ref 31–125)
BUN: 14 mg/dL (ref 7–25)
CO2: 28 mmol/L (ref 20–32)
Calcium: 9.4 mg/dL (ref 8.6–10.2)
Chloride: 103 mmol/L (ref 98–110)
Creat: 0.94 mg/dL (ref 0.50–0.99)
Globulin: 2.9 g/dL (ref 1.9–3.7)
Glucose, Bld: 96 mg/dL (ref 65–99)
Potassium: 4.3 mmol/L (ref 3.5–5.3)
Sodium: 139 mmol/L (ref 135–146)
Total Bilirubin: 0.3 mg/dL (ref 0.2–1.2)
Total Protein: 7.3 g/dL (ref 6.1–8.1)
eGFR: 75 mL/min/1.73m2 (ref >=60)

## 2024-04-17 LAB — CBC WITH DIFFERENTIAL/PLATELET
Absolute Lymphocytes: 1693 {cells}/uL (ref 850–3900)
Absolute Monocytes: 431 {cells}/uL (ref 200–950)
Basophils Absolute: 21 {cells}/uL (ref 0–200)
Basophils Relative: 0.5 %
Eosinophils Absolute: 41 {cells}/uL (ref 15–500)
Eosinophils Relative: 1 %
HCT: 37 % (ref 35.0–45.0)
Hemoglobin: 12.6 g/dL (ref 11.7–15.5)
MCH: 30.3 pg (ref 27.0–33.0)
MCHC: 34.1 g/dL (ref 32.0–36.0)
MCV: 88.9 fL (ref 80.0–100.0)
MPV: 10.5 fL (ref 7.5–12.5)
Monocytes Relative: 10.5 %
Neutro Abs: 1915 {cells}/uL (ref 1500–7800)
Neutrophils Relative %: 46.7 %
Platelets: 193 Thousand/uL (ref 140–400)
RBC: 4.16 Million/uL (ref 3.80–5.10)
RDW: 13.7 % (ref 11.0–15.0)
Total Lymphocyte: 41.3 %
WBC: 4.1 Thousand/uL (ref 3.8–10.8)

## 2024-04-17 LAB — LIPID PANEL
Cholesterol: 232 mg/dL — ABNORMAL HIGH (ref <200)
HDL: 46 mg/dL — ABNORMAL LOW (ref >=50)
LDL Cholesterol (Calc): 165 mg/dL — ABNORMAL HIGH
Non-HDL Cholesterol (Calc): 186 mg/dL — ABNORMAL HIGH (ref <130)
Total CHOL/HDL Ratio: 5 (calc) — ABNORMAL HIGH (ref <5.0)
Triglycerides: 101 mg/dL (ref <150)

## 2024-04-17 LAB — C. TRACHOMATIS/N. GONORRHOEAE RNA
C. trachomatis RNA, TMA: NOT DETECTED
N. gonorrhoeae RNA, TMA: NOT DETECTED

## 2024-04-23 ENCOUNTER — Ambulatory Visit: Admitting: Nurse Practitioner

## 2024-04-23 DIAGNOSIS — E669 Obesity, unspecified: Secondary | ICD-10-CM | POA: Diagnosis not present

## 2024-04-23 DIAGNOSIS — I10 Essential (primary) hypertension: Secondary | ICD-10-CM | POA: Diagnosis not present

## 2024-04-23 DIAGNOSIS — E890 Postprocedural hypothyroidism: Secondary | ICD-10-CM | POA: Diagnosis not present

## 2024-05-14 ENCOUNTER — Telehealth: Payer: Self-pay | Admitting: *Deleted

## 2024-05-14 NOTE — Telephone Encounter (Signed)
 Call returned to patient, no answer, mailbox full.

## 2024-05-14 NOTE — Telephone Encounter (Signed)
 Patient left message on triage line requesting Rx for Yeast, tx for BV recently.   Per review of EPIC, patient seen on 04/16/24, tx for BV with flagyl .  Call returned to patient, no answer, no voicemail.

## 2024-05-15 ENCOUNTER — Other Ambulatory Visit: Payer: Self-pay | Admitting: Nurse Practitioner

## 2024-05-15 DIAGNOSIS — N760 Acute vaginitis: Secondary | ICD-10-CM

## 2024-05-15 MED ORDER — FLUCONAZOLE 150 MG PO TABS: 150.0000 mg | ORAL_TABLET | ORAL | 0 refills | Status: AC

## 2024-08-31 ENCOUNTER — Telehealth: Payer: Self-pay | Admitting: *Deleted

## 2024-08-31 NOTE — Telephone Encounter (Signed)
 Patient in [facility] hold.  Right breast Bx 04/09/24 -return for bilateral axillary US  in 3 months   Spoke with patient, states she has not scheduled. Advised our office will fax order to [facility], contact [facility] for scheduling. If any additional assistance is needed, contact the office. Patient agreeable.   Order printed to be signed and faxed to [facility] [phone].   Routing to provider for final review. Patient is agreeable to disposition. Will close encounter.

## 2024-08-31 NOTE — Telephone Encounter (Signed)
 Order faxed to [facility] at [phone]. Confirmation received.
